# Patient Record
Sex: Female | Born: 1975 | Race: White | Hispanic: Yes | Marital: Married | Smoking: Never smoker
Health system: Southern US, Community
[De-identification: ages and names within clinical notes are randomized; demographics above are authoritative.]

## PROBLEM LIST (undated history)

## (undated) DIAGNOSIS — I10 Essential (primary) hypertension: Secondary | ICD-10-CM

## (undated) DIAGNOSIS — E119 Type 2 diabetes mellitus without complications: Secondary | ICD-10-CM

---

## 2005-02-23 ENCOUNTER — Inpatient Hospital Stay: Payer: Self-pay | Admitting: Unknown Physician Specialty

## 2006-04-21 ENCOUNTER — Ambulatory Visit: Payer: Self-pay | Admitting: Obstetrics and Gynecology

## 2007-07-04 ENCOUNTER — Encounter: Payer: Self-pay | Admitting: Maternal & Fetal Medicine

## 2007-10-06 ENCOUNTER — Encounter: Payer: Self-pay | Admitting: Maternal & Fetal Medicine

## 2007-11-29 ENCOUNTER — Inpatient Hospital Stay: Payer: Self-pay

## 2009-07-14 ENCOUNTER — Emergency Department: Payer: Self-pay | Admitting: Emergency Medicine

## 2012-08-24 ENCOUNTER — Emergency Department: Payer: Self-pay | Admitting: Emergency Medicine

## 2013-02-01 ENCOUNTER — Encounter: Payer: Self-pay | Admitting: *Deleted

## 2013-03-23 ENCOUNTER — Encounter: Payer: Self-pay | Admitting: Obstetrics & Gynecology

## 2013-06-28 ENCOUNTER — Emergency Department: Payer: Self-pay | Admitting: Emergency Medicine

## 2013-06-28 LAB — CBC WITH DIFFERENTIAL/PLATELET
Basophil #: 0 10*3/uL (ref 0.0–0.1)
Basophil %: 0.5 %
Eosinophil #: 0.2 10*3/uL (ref 0.0–0.7)
Eosinophil %: 2.3 %
HCT: 41.2 % (ref 35.0–47.0)
HGB: 14 g/dL (ref 12.0–16.0)
LYMPHS ABS: 2 10*3/uL (ref 1.0–3.6)
LYMPHS PCT: 26 %
MCH: 31.5 pg (ref 26.0–34.0)
MCHC: 33.9 g/dL (ref 32.0–36.0)
MCV: 93 fL (ref 80–100)
MONOS PCT: 7.3 %
Monocyte #: 0.6 x10 3/mm (ref 0.2–0.9)
Neutrophil #: 5 10*3/uL (ref 1.4–6.5)
Neutrophil %: 63.9 %
Platelet: 252 10*3/uL (ref 150–440)
RBC: 4.43 10*6/uL (ref 3.80–5.20)
RDW: 13.7 % (ref 11.5–14.5)
WBC: 7.8 10*3/uL (ref 3.6–11.0)

## 2013-06-28 LAB — URINALYSIS, COMPLETE
Bacteria: NONE SEEN
Bilirubin,UR: NEGATIVE
Blood: NEGATIVE
Ketone: NEGATIVE
LEUKOCYTE ESTERASE: NEGATIVE
NITRITE: NEGATIVE
PH: 7 (ref 4.5–8.0)
Protein: NEGATIVE
RBC, UR: NONE SEEN /HPF (ref 0–5)
Specific Gravity: 1.022 (ref 1.003–1.030)
Squamous Epithelial: 2

## 2013-06-28 LAB — COMPREHENSIVE METABOLIC PANEL
Albumin: 3.6 g/dL (ref 3.4–5.0)
Alkaline Phosphatase: 168 U/L — ABNORMAL HIGH
Anion Gap: 6 — ABNORMAL LOW (ref 7–16)
BILIRUBIN TOTAL: 0.4 mg/dL (ref 0.2–1.0)
BUN: 6 mg/dL — ABNORMAL LOW (ref 7–18)
CALCIUM: 8.5 mg/dL (ref 8.5–10.1)
Chloride: 109 mmol/L — ABNORMAL HIGH (ref 98–107)
Co2: 27 mmol/L (ref 21–32)
Creatinine: 0.42 mg/dL — ABNORMAL LOW (ref 0.60–1.30)
EGFR (African American): >60
Glucose: 231 mg/dL — ABNORMAL HIGH (ref 65–99)
OSMOLALITY: 288 (ref 275–301)
Potassium: 3.7 mmol/L (ref 3.5–5.1)
SGOT(AST): 86 U/L — ABNORMAL HIGH (ref 15–37)
SGPT (ALT): 91 U/L — ABNORMAL HIGH (ref 12–78)
Sodium: 142 mmol/L (ref 136–145)
Total Protein: 7.6 g/dL (ref 6.4–8.2)

## 2013-06-28 LAB — TROPONIN I: Troponin-I: <0.02 ng/mL

## 2013-06-28 LAB — LIPASE, BLOOD: Lipase: 194 U/L (ref 73–393)

## 2015-04-18 ENCOUNTER — Ambulatory Visit (INDEPENDENT_AMBULATORY_CARE_PROVIDER_SITE_OTHER)
Admission: RE | Admit: 2015-04-18 | Discharge: 2015-04-18 | Disposition: A | Discharge status: Home / Self Care | Payer: BLUE CROSS/BLUE SHIELD | Source: Ambulatory Visit | Attending: Internal Medicine | Admitting: Internal Medicine

## 2015-04-18 ENCOUNTER — Ambulatory Visit (INDEPENDENT_AMBULATORY_CARE_PROVIDER_SITE_OTHER): Payer: BLUE CROSS/BLUE SHIELD | Admitting: Internal Medicine

## 2015-04-18 ENCOUNTER — Encounter: Payer: Self-pay | Admitting: Internal Medicine

## 2015-04-18 VITALS — BP 134/82 | HR 83 | Temp 98.2°F | Ht 64.33 in | Wt 244.0 lb

## 2015-04-18 DIAGNOSIS — N611 Abscess of the breast and nipple: Secondary | ICD-10-CM

## 2015-04-18 DIAGNOSIS — M25572 Pain in left ankle and joints of left foot: Secondary | ICD-10-CM

## 2015-04-18 NOTE — Progress Notes (Signed)
Pre visit review using our clinic review tool, if applicable. No additional management support is needed unless otherwise documented below in the visit note. 

## 2015-04-18 NOTE — Patient Instructions (Signed)
Absceso (Abscess)  Un absceso es una zona infectada que contiene pus y desechos.Puede aparecer en cualquier parte del cuerpo. Tambin se lo conoce como fornculo o divieso. CAUSAS  Ocurre cuando los tejidos se infectan. Tambin puede formarse por obstruccin de las glndulas sebceas o las glndulas sudorparas, infeccin de los folculos pilosos o por una lesin pequea en la piel. A medida que el organismo lucha contra la infeccin, se acumula pus en la zona y hace presin debajo de la piel. Esta presin causa dolor. Las personas con un sistema inmunolgico debilitado tienen dificultad para Pension scheme manager las infecciones y pueden formar abscesos con ms frecuencia.  SNTOMAS  Generalmente un absceso se forma sobre la piel y se vuelve una masa dolorosa, roja, caliente y sensible. Si se forma debajo de la piel, podr sentir como una zona blanda, que se [place], debajo de la piel. Algunos abscesos se abren (ruptura) por s mismos, pero la mayora seguir empeorando si no se lo trata. La infeccin puede diseminarse hacia otros sitios del cuerpo y finalmente al torrente sanguneo y hace que el enfermo se sienta mal.  DIAGNSTICO  El mdico le har una historia clnica y un examen fsico. Podrn tomarle Truddie Coco de lquido del absceso y Web designer para Pension scheme manager la causa de la infeccin. .  TRATAMIENTO  El mdico le indicar antibiticos para combatir la infeccin. Sin embargo, el uso de antibiticos solamente no curar el absceso. El mdico tendr que hacer un pequeo corte (incisin) en el absceso para drenar el pus. En algunos casos se introduce una gasa en el absceso para reducir Conservation officer, historic buildings y que siga drenando la zona.  INSTRUCCIONES PARA EL CUIDADO EN EL HOGAR   Solo tome medicamentos de venta libre o recetados para Conservation officer, historic buildings, Tree surgeon o fiebre, segn las indicaciones del mdico.  Si le han recetado antibiticos, tmelos segn las indicaciones. Tmelos todos, aunque se sienta mejor.  Si le aplicaron  una gasa, siga las indicaciones del mdico para [place].  Para evitar la propagacin de la infeccin:  Mantenga el absceso cubierto con el vendaje.  Lvese bien las manos.  No comparta artculos de cuidado personal, toallas o jacuzzis con los dems.  Evite el contacto con la piel de Producer, television/film/video.  Mantenga la piel y la ropa limpia alrededor del absceso.  Cumpla con todas las visitas de control, segn le indique su mdico. SOLICITE ATENCIN MDICA SI:   Aumenta el dolor, la hinchazn, el enrojecimiento, drena lquido o sangra.  Siente dolores musculares, escalofros, o una sensacin general de Tree surgeon.  Tiene fiebre. ASEGRESE DE QUE:   Comprende estas instrucciones.  Controlar su enfermedad.  Solicitar ayuda de inmediato si no mejora o si empeora.   Esta informacin no tiene Marine scientist el consejo del mdico. Asegrese de hacerle al mdico cualquier pregunta que tenga.   Document Released: 02/23/2005 Document Revised: 08/25/2011 Elsevier Interactive Patient Education [employer].

## 2015-04-18 NOTE — Progress Notes (Signed)
HPI  Pt presents to the clinic today to establish care. She is spanish speaking only and did not notify us before hand that she needed an interpreter, so the interview and exam were very difficult.  She has a rash to her left breast. This started 3 weeks ago. At one point, the rash busted, leaked pus and blood. It has healed over but now the area is very itchy. She has not tried anything OTC.  She also reports left ankle pain. This started 3 years ago. The pain is worse with walking or standing for long periods of time. She is not sure if there has been any swelling. She can not tell me if there has been an injury. She takes Ibuprofen but is not sure if it helps.  Flu: 01/2015 Tetanus: unsure Pap Smear: 2016 Dentist: as needed  History reviewed. No pertinent past medical history.  No current outpatient prescriptions on file.   No current facility-administered medications for this visit.    No Known Allergies  Family History  Problem Relation Age of Onset  . Diabetes Mother     Social History   Social History  . Marital Status: Married    Spouse Name: N/A  . Number of Children: N/A  . Years of Education: N/A   Occupational History  . Not on file.   Social History Main Topics  . Smoking status: Never Smoker   . Smokeless tobacco: Never Used  . Alcohol Use: 0.0 oz/week    0 Standard drinks or equivalent per week     Comment: occasional  . Drug Use: No  . Sexual Activity: Not on file   Other Topics Concern  . Not on file   Social History Narrative  . No narrative on file    ROS:  Constitutional: Denies fever, malaise, fatigue, headache or abrupt weight changes.  Respiratory: Denies difficulty breathing, shortness of breath, cough or sputum production.   Cardiovascular: Denies chest pain, chest tightness, palpitations or swelling in the hands or feet.  Musculoskeletal: Pt reports left ankle pain. Denies decrease in range of motion, difficulty with gait, muscle  pain or joint swelling.  Skin: Pt reports rash to left breast. Denies redness, lesions or ulcercations.  Neurological: Denies dizziness, difficulty with memory, difficulty with speech or problems with balance and coordination.  Psych: Denies anxiety, depression, SI/HI.  No other specific complaints in a complete review of systems (except as listed in HPI above).  PE:  BP 134/82 mmHg  Pulse 83  Temp(Src) 98.2 F (36.8 C) (Oral)  Ht 5' 4.33" (1.634 m)  Wt 244 lb (110.678 kg)  BMI 41.45 kg/m2  SpO2 98%  LMP 04/18/2015 Wt Readings from Last 3 Encounters:  04/18/15 244 lb (110.678 kg)    General: Appears her stated age, obese in NAD.  Skin: Small, round, < 1 cm healed abscess noted just adjacent to the nipple, at 2 oclock. No drainage, redness or tenderness noted. Cardiovascular: Normal rate and rhythm. S1,S2 noted.  No murmur, rubs or gallops noted.  Pulmonary/Chest: Normal effort and positive vesicular breath sounds. No respiratory distress. No wheezes, rales or ronchi noted.  Musculoskeletal: Normal flexion, extension and rotation of the left ankle. Generally tender with palpation. Strength 5/5 BLE. No signs of joint swelling. No difficulty with gait.  Neurological: Alert and oriented.   Psychiatric: Mood and affect normal. Behavior is normal. Judgment and thought content normal.     BMET    Component Value Date/Time   NA 142 06/28/2013  2107   K 3.7 06/28/2013 2107   CL 109* 06/28/2013 2107   CO2 27 06/28/2013 2107   GLUCOSE 231* 06/28/2013 2107   BUN 6* 06/28/2013 2107   CREATININE 0.42* 06/28/2013 2107   CALCIUM 8.5 06/28/2013 2107   GFRNONAA >60 06/28/2013 2107   GFRAA >60 06/28/2013 2107    Lipid Panel  No results found for: CHOL, TRIG, HDL, CHOLHDL, VLDL, LDLCALC  CBC    Component Value Date/Time   WBC 7.8 06/28/2013 2107   RBC 4.43 06/28/2013 2107   HGB 14.0 06/28/2013 2107   HCT 41.2 06/28/2013 2107   PLT 252 06/28/2013 2107   MCV 93 06/28/2013 2107    MCH 31.5 06/28/2013 2107   MCHC 33.9 06/28/2013 2107   RDW 13.7 06/28/2013 2107   LYMPHSABS 2.0 06/28/2013 2107   MONOABS 0.6 06/28/2013 2107   EOSABS 0.2 06/28/2013 2107   BASOSABS 0.0 06/28/2013 2107    Hgb A1C No results found for: HGBA1C   Assessment and Plan:  Abscess of left breast:  Seems to be resolving Hydrocortisone cream 1% BID prn for itching Handout given in spanish with return precautions  Left ankle pain:  Exam benign Will check xray of left ankle today  Make an appt for your annual exam, please make sure to let the front desk know you need an interpreter.

## 2015-08-02 ENCOUNTER — Other Ambulatory Visit: Payer: Self-pay | Admitting: Podiatry

## 2015-08-02 DIAGNOSIS — T148XXA Other injury of unspecified body region, initial encounter: Secondary | ICD-10-CM

## 2015-08-22 ENCOUNTER — Ambulatory Visit
Admission: RE | Admit: 2015-08-22 | Discharge: 2015-08-22 | Disposition: A | Discharge status: Home / Self Care | Payer: BLUE CROSS/BLUE SHIELD | Source: Ambulatory Visit | Attending: Podiatry | Admitting: Podiatry

## 2015-08-22 DIAGNOSIS — X58XXXA Exposure to other specified factors, initial encounter: Secondary | ICD-10-CM | POA: Insufficient documentation

## 2015-08-22 DIAGNOSIS — S96812A Strain of other specified muscles and tendons at ankle and foot level, left foot, initial encounter: Secondary | ICD-10-CM | POA: Insufficient documentation

## 2015-08-22 DIAGNOSIS — M659 Synovitis and tenosynovitis, unspecified: Secondary | ICD-10-CM | POA: Insufficient documentation

## 2015-08-22 DIAGNOSIS — T148XXA Other injury of unspecified body region, initial encounter: Secondary | ICD-10-CM

## 2015-08-22 DIAGNOSIS — T148 Other injury of unspecified body region: Secondary | ICD-10-CM | POA: Diagnosis present

## 2015-11-18 ENCOUNTER — Other Ambulatory Visit (HOSPITAL_COMMUNITY): Payer: Self-pay | Admitting: *Deleted

## 2015-11-18 DIAGNOSIS — N6452 Nipple discharge: Secondary | ICD-10-CM

## 2015-11-18 DIAGNOSIS — N644 Mastodynia: Secondary | ICD-10-CM

## 2015-11-22 ENCOUNTER — Ambulatory Visit
Admission: RE | Admit: 2015-11-22 | Discharge: 2015-11-22 | Disposition: A | Discharge status: Home / Self Care | Payer: No Typology Code available for payment source | Source: Ambulatory Visit | Attending: Obstetrics and Gynecology | Admitting: Obstetrics and Gynecology

## 2015-11-22 ENCOUNTER — Other Ambulatory Visit (HOSPITAL_COMMUNITY): Payer: Self-pay | Admitting: Obstetrics and Gynecology

## 2015-11-22 ENCOUNTER — Ambulatory Visit (HOSPITAL_COMMUNITY)
Admission: RE | Admit: 2015-11-22 | Discharge: 2015-11-22 | Disposition: A | Discharge status: Home / Self Care | Payer: Self-pay | Source: Ambulatory Visit | Attending: Obstetrics and Gynecology | Admitting: Obstetrics and Gynecology

## 2015-11-22 ENCOUNTER — Encounter (HOSPITAL_COMMUNITY): Payer: Self-pay | Admitting: *Deleted

## 2015-11-22 VITALS — BP 118/80 | Temp 98.9°F | Ht 63.0 in | Wt 255.4 lb

## 2015-11-22 DIAGNOSIS — N6452 Nipple discharge: Secondary | ICD-10-CM

## 2015-11-22 DIAGNOSIS — Z1239 Encounter for other screening for malignant neoplasm of breast: Secondary | ICD-10-CM

## 2015-11-22 DIAGNOSIS — N644 Mastodynia: Secondary | ICD-10-CM

## 2015-11-22 NOTE — Patient Instructions (Signed)
Explained breast self awareness to [employer]. Patient did not need a Pap smear today due to last Pap smear was 07/06/2014. Let her know BCCCP will cover Pap smears every 3 years unless has a history of abnormal Pap smears. Referred patient to the [facility] for diagnostic mammogram and possible left breast ultrasound. Appointment scheduled for Friday, November 22, 2015 at 1530. Breckyn Wenstrom verbalized understanding.  [place], Arvil Chaco, RN 3:45 PM

## 2015-11-22 NOTE — Progress Notes (Signed)
Complaints of left breast tenderness that comes and goes. Patient rates pain at a 4 out of 10. Patient states there is an area on the left outer breast next to the nipple that starts to itch then hurts and she squeezes a bloody/whitish colored discharge from area.   Pap Smear:  Pap smear not completed today. Last Pap smear was 07/06/2014 at the [facility] and normal. Per patient has no history of an abnormal Pap smear. Last Pap smear result is in EPIC.  Physical exam: Breasts Breasts symmetrical. No skin abnormalities right breast. On left breast on areola at 2 o'clock there is a patch of thickened skin with a scab. Patient stated she squeezed the discharge out a couple of days ago. Unable to express any discharge from area on left breast. No nipple retraction bilateral breasts. No nipple discharge bilateral breasts. No lymphadenopathy. No lumps palpated bilateral breasts.Complaints of tenderness around 2 o'clock left breast where the area of concern is located. Referred patient to the [facility] for diagnostic mammogram and possible left breast ultrasound. Appointment scheduled for Friday, November 22, 2015 at 1530.        Pelvic/Bimanual No Pap smear completed today since last Pap smear was 07/06/2014. Pap smear not indicated per BCCCP guidelines.   Smoking History: Patient has never smoked.  Patient Navigation: Patient education provided. Access to services provided for patient through [facility] program.Spanish interpreter provided.   Used Spanish interpreter [employer] from [place].

## 2015-11-26 ENCOUNTER — Encounter (HOSPITAL_COMMUNITY): Payer: Self-pay | Admitting: *Deleted

## 2015-12-06 ENCOUNTER — Other Ambulatory Visit: Payer: No Typology Code available for payment source

## 2016-10-28 ENCOUNTER — Encounter (HOSPITAL_COMMUNITY): Payer: Self-pay

## 2016-10-28 ENCOUNTER — Emergency Department (HOSPITAL_COMMUNITY): Payer: Self-pay

## 2016-10-28 ENCOUNTER — Emergency Department (HOSPITAL_COMMUNITY)
Admission: EM | Admit: 2016-10-28 | Discharge: 2016-10-29 | Disposition: A | Discharge status: Home / Self Care | Payer: Self-pay | Attending: Emergency Medicine | Admitting: Emergency Medicine

## 2016-10-28 DIAGNOSIS — K8050 Calculus of bile duct without cholangitis or cholecystitis without obstruction: Secondary | ICD-10-CM | POA: Insufficient documentation

## 2016-10-28 DIAGNOSIS — Z791 Long term (current) use of non-steroidal anti-inflammatories (NSAID): Secondary | ICD-10-CM | POA: Insufficient documentation

## 2016-10-28 DIAGNOSIS — R109 Unspecified abdominal pain: Secondary | ICD-10-CM

## 2016-10-28 DIAGNOSIS — R079 Chest pain, unspecified: Secondary | ICD-10-CM | POA: Insufficient documentation

## 2016-10-28 LAB — URINALYSIS, ROUTINE W REFLEX MICROSCOPIC
BILIRUBIN URINE: NEGATIVE
GLUCOSE, UA: NEGATIVE mg/dL
Ketones, ur: 5 mg/dL — AB
Leukocytes, UA: NEGATIVE
NITRITE: NEGATIVE
PROTEIN: 30 mg/dL — AB
Specific Gravity, Urine: >1.046 — ABNORMAL HIGH (ref 1.005–1.030)
pH: 5 (ref 5.0–8.0)

## 2016-10-28 LAB — HEPATIC FUNCTION PANEL
ALT: 19 U/L (ref 14–54)
AST: 17 U/L (ref 15–41)
Albumin: 3.7 g/dL (ref 3.5–5.0)
Alkaline Phosphatase: 91 U/L (ref 38–126)
BILIRUBIN DIRECT: 0.2 mg/dL (ref 0.1–0.5)
BILIRUBIN INDIRECT: 0.6 mg/dL (ref 0.3–0.9)
TOTAL PROTEIN: 7.5 g/dL (ref 6.5–8.1)
Total Bilirubin: 0.8 mg/dL (ref 0.3–1.2)

## 2016-10-28 LAB — CBC
HEMATOCRIT: 43.2 % (ref 36.0–46.0)
Hemoglobin: 14.6 g/dL (ref 12.0–15.0)
MCH: 30.7 pg (ref 26.0–34.0)
MCHC: 33.8 g/dL (ref 30.0–36.0)
MCV: 90.9 fL (ref 78.0–100.0)
PLATELETS: 274 10*3/uL (ref 150–400)
RBC: 4.75 MIL/uL (ref 3.87–5.11)
RDW: 13.4 % (ref 11.5–15.5)
WBC: 9.9 10*3/uL (ref 4.0–10.5)

## 2016-10-28 LAB — BASIC METABOLIC PANEL
Anion gap: 7 (ref 5–15)
BUN: 6 mg/dL (ref 6–20)
CO2: 24 mmol/L (ref 22–32)
CREATININE: 0.46 mg/dL (ref 0.44–1.00)
Calcium: 8.9 mg/dL (ref 8.9–10.3)
Chloride: 105 mmol/L (ref 101–111)
GFR calc Af Amer: >60 mL/min (ref >60)
Glucose, Bld: 114 mg/dL — ABNORMAL HIGH (ref 65–99)
POTASSIUM: 3.8 mmol/L (ref 3.5–5.1)
SODIUM: 136 mmol/L (ref 135–145)

## 2016-10-28 LAB — I-STAT TROPONIN, ED: Troponin i, poc: 0 ng/mL (ref 0.00–0.08)

## 2016-10-28 LAB — D-DIMER, QUANTITATIVE (NOT AT ARMC): D DIMER QUANT: 0.53 ug{FEU}/mL — AB (ref 0.00–0.50)

## 2016-10-28 MED ORDER — MORPHINE SULFATE (PF) 4 MG/ML IV SOLN
4.0000 mg | Freq: Once | INTRAVENOUS | Status: AC
  Administered 2016-10-28: 4 mg via INTRAVENOUS
  Filled 2016-10-28: qty 1

## 2016-10-28 MED ORDER — KETOROLAC TROMETHAMINE 60 MG/2ML IM SOLN
60.0000 mg | Freq: Once | INTRAMUSCULAR | Status: AC
  Administered 2016-10-28: 60 mg via INTRAMUSCULAR
  Filled 2016-10-28: qty 2

## 2016-10-28 MED ORDER — OXYCODONE-ACETAMINOPHEN 5-325 MG PO TABS
2.0000 | ORAL_TABLET | Freq: Once | ORAL | Status: AC
  Administered 2016-10-28: 2 via ORAL
  Filled 2016-10-28: qty 2

## 2016-10-28 MED ORDER — IOPAMIDOL (ISOVUE-370) INJECTION 76%
INTRAVENOUS | Status: AC
  Administered 2016-10-28: 100 mL
  Filled 2016-10-28: qty 100

## 2016-10-28 MED ORDER — OXYCODONE-ACETAMINOPHEN 5-325 MG PO TABS: 2.0000 | ORAL_TABLET | ORAL | 0 refills | Status: DC | PRN

## 2016-10-28 MED ORDER — ORPHENADRINE CITRATE 30 MG/ML IJ SOLN
60.0000 mg | Freq: Two times a day (BID) | INTRAMUSCULAR | Status: DC
  Filled 2016-10-28: qty 2

## 2016-10-28 MED ORDER — CYCLOBENZAPRINE HCL 10 MG PO TABS
10.0000 mg | ORAL_TABLET | Freq: Two times a day (BID) | ORAL | Status: DC
  Administered 2016-10-28: 10 mg via ORAL
  Filled 2016-10-28: qty 1

## 2016-10-28 NOTE — ED Provider Notes (Signed)
[facility] DEPT Provider Note   CSN: [id number] Arrival date & time: 10/28/16  1308     History   Chief Complaint Chief Complaint  Patient presents with  . Chest Pain  . Flank Pain    HPI Janet Franco is a 41 y.o. female.  HPI Patient reports she developed a very severe pain in her right flank day before yesterday. Any kind of movement makes it much worse. Coughing makes it worse, deep breath makes it worse. Patient reports she also feels a feeling in her right arm like it is "going to sleep". Patient denies she feels short of breath. She denies she's had any cough or fever. She does not have abdominal pain but she does report when I push in her abdomen that makes the pain in her back worse. Patient denies any pain or burning with urination. She denies any blood in the urine. He denies history of kidney stone. Patient denies pain or swelling in her lower legs. She denies any history of blood clot. She denies family history of blood clot. Patient did have recent long car trip driving from [place]. Patient did try applying a type of salve or balm which gave some relief. Patient reports she has her tubes tied. She reports she is not menstruating. History reviewed. No pertinent past medical history.  There are no active problems to display for this patient.   History reviewed. No pertinent surgical history.  OB History    Gravida Para Term Preterm AB Living   0 0 0 0 0 0   SAB TAB Ectopic Multiple Live Births   0 0 0 0 0       Home Medications    Prior to Admission medications   Medication Sig Start Date End Date Taking? Authorizing Provider  ibuprofen (ADVIL,MOTRIN) 200 MG tablet Take 200-400 mg by mouth every 6 (six) hours as needed (for pain).   Yes [provider]    Family History Family History  Problem Relation Age of Onset  . Diabetes Mother     Social History Social History  Substance Use Topics  . Smoking status: Never Smoker  . Smokeless  tobacco: Never Used  . Alcohol use 0.0 oz/week     Comment: occasional     Allergies   Patient has no known allergies.   Review of Systems Review of Systems 10 Systems reviewed and are negative for acute change except as noted in the HPI.   Physical Exam Updated Vital Signs BP 110/68   Pulse 78   Temp 98.7 F (37.1 C) (Oral)   Resp 18   Ht 5\' 5"  (1.651 m)   SpO2 96%   Physical Exam  Constitutional:  Patient is alert and nontoxic. No respiratory distress. She does appear to be in significant pain holding her right side of the back. Small movements cause her to wince.  HENT:  Head: Normocephalic and atraumatic.  Eyes: EOM are normal.  Neck: Neck supple.  Cardiovascular: Normal rate, regular rhythm, normal heart sounds and intact distal pulses.   Pulmonary/Chest: Effort normal and breath sounds normal.  Patient does not have a significantly reproducible chest wall pain. She does report however any small movements that she make such as twisting or bending forward reproduce the pain. No Rashes of the chest wall. No soft tissue anomaly.  Abdominal: Soft. She exhibits no distension. There is no tenderness. There is no guarding.  Patient denies palpation tenderness to the abdomen but she does report that pressure  in the right upper quadrant makes the pain in her back worse.  Musculoskeletal: Normal range of motion. She exhibits no edema or tenderness.  Patient has no peripheral edema of the lower extremity. She has no tenderness to palpation in the popliteal fossa or the calves.  Skin: Skin is warm and dry.  Psychiatric: She has a normal mood and affect.     ED Treatments / Results  Labs (all labs ordered are listed, but only abnormal results are displayed) Labs Reviewed  BASIC METABOLIC PANEL - Abnormal; Notable for the following:       Result Value   Glucose, Bld 114 (*)    All other components within normal limits  URINALYSIS, ROUTINE W REFLEX MICROSCOPIC - Abnormal;  Notable for the following:    Specific Gravity, Urine >1.046 (*)    Hgb urine dipstick SMALL (*)    Ketones, ur 5 (*)    Protein, ur 30 (*)    Bacteria, UA RARE (*)    Squamous Epithelial / LPF 0-5 (*)    All other components within normal limits  D-DIMER, QUANTITATIVE (NOT AT [facility]) - Abnormal; Notable for the following:    D-Dimer, Quant 0.53 (*)    All other components within normal limits  CBC  HEPATIC FUNCTION PANEL  I-STAT TROPONIN, ED    EKG  EKG Interpretation  Date/Time:  Wednesday October 28 2016 13:59:01 EDT Ventricular Rate:  91 PR Interval:  152 QRS Duration: 92 QT Interval:  370 QTC Calculation: 455 R Axis:   6 Text Interpretation:  Normal sinus rhythm Cannot rule out Anterior infarct , age undetermined Abnormal ECG no sig change from previous. Confirmed by Charlesetta Shanks [id number]) on 10/28/2016 3:56:03 PM       Radiology Dg Chest 2 View  Result Date: 10/28/2016 CLINICAL DATA:  Posterior chest pain. EXAM: CHEST  2 VIEW COMPARISON:  None. FINDINGS: The cardiomediastinal silhouette is at the upper limits of normal in size. Normal pulmonary vascularity. Low lung volumes are present, causing crowding of the pulmonary vasculature. No focal consolidation, pleural effusion, or pneumothorax. No acute osseous abnormality. IMPRESSION: No active cardiopulmonary disease. Electronically Signed   By: Titus Dubin M.D.   On: 10/28/2016 14:14   Ct Angio Chest Pe W/cm &/or Wo Cm  Result Date: 10/28/2016 CLINICAL DATA:  Chest pain and dyspnea.  Right flank pain. EXAM: CT ANGIOGRAPHY CHEST CT ABDOMEN AND PELVIS WITH CONTRAST TECHNIQUE: Multidetector CT imaging of the chest was performed using the standard protocol during bolus administration of intravenous contrast. Multiplanar CT image reconstructions and MIPs were obtained to evaluate the vascular anatomy. Multidetector CT imaging of the abdomen and pelvis was performed using the standard protocol during bolus administration of  intravenous contrast. CONTRAST:  100 cc Isovue 370 COMPARISON:  None. FINDINGS: CTA CHEST FINDINGS Cardiovascular: The heart is normal in size. No pericardial effusion. The aorta is normal in caliber. No dissection. No atherosclerotic calcifications. The branch vessels are patent. No coronary artery calcifications are identified. The pulmonary arterial tree is fairly well opacified. No filling defects to suggest pulmonary embolism. Mediastinum/Nodes: No mediastinal or hilar mass or adenopathy. The esophagus is grossly normal. Lungs/Pleura: Exam is somewhat limited by body habitus and respiratory motion. There is a small right pleural effusion with overlying atelectasis. Minimal left basilar atelectasis also noted. No definite infiltrates or pulmonary lesions. Chest wall/ Musculoskeletal: No breast masses, supraclavicular or axillary lymphadenopathy. Thyroid gland appears normal. No significant bony findings. Review of the MIP images confirms the above  findings. CT ABDOMEN and PELVIS FINDINGS Hepatobiliary: No focal hepatic lesions or intrahepatic biliary dilatation. Suspect gallstones. No definite gallbladder wall thickening or pericholecystic fluid. There is common bile duct dilatation with maximum measurement of 10 mm. It does taper of to the ampulla in the head of the pancreas. Recommend correlation with liver function studies. If these are abnormal I would started with a right upper quadrant ultrasound examination. Pancreas: No mass, inflammation or ductal dilatation. Spleen: Normal size.  No focal lesions. Adrenals/Urinary Tract: The adrenal glands and kidneys are unremarkable. No renal, ureteral or bladder calculi or mass. Stomach/Bowel: The stomach, duodenum, small bowel and colon are unremarkable. No acute inflammatory process, mass lesions or obstructive findings. The terminal ileum is normal. The appendix is normal. Vascular/Lymphatic: The aorta is normal in caliber. No dissection. The branch vessels are  patent. The major venous structures are patent. No mesenteric or retroperitoneal mass or adenopathy. Small scattered lymph nodes are noted. Reproductive: An IUD is noted in the endometrial canal. No complicating features. Both ovaries are normal. Other: No pelvic mass or adenopathy. No free pelvic fluid collections. No inguinal mass or adenopathy. No abdominal wall hernia or subcutaneous lesions. Musculoskeletal: No significant bony findings. Review of the MIP images confirms the above findings. IMPRESSION: 1. No CT findings for pulmonary embolism. 2. Small right effusion and right lower lobe atelectasis. 3. Normal thoracic aorta. 4. Suspect gallstones. There is also common bile duct dilatation. Recommend correlation with liver function studies. Right upper quadrant ultrasound may be helpful for further evaluation. 5. No other significant abdominal/pelvic findings. Electronically Signed   By: Marijo Sanes M.D.   On: 10/28/2016 19:18   Ct Abdomen Pelvis W Contrast  Result Date: 10/28/2016 CLINICAL DATA:  Chest pain and dyspnea.  Right flank pain. EXAM: CT ANGIOGRAPHY CHEST CT ABDOMEN AND PELVIS WITH CONTRAST TECHNIQUE: Multidetector CT imaging of the chest was performed using the standard protocol during bolus administration of intravenous contrast. Multiplanar CT image reconstructions and MIPs were obtained to evaluate the vascular anatomy. Multidetector CT imaging of the abdomen and pelvis was performed using the standard protocol during bolus administration of intravenous contrast. CONTRAST:  100 cc Isovue 370 COMPARISON:  None. FINDINGS: CTA CHEST FINDINGS Cardiovascular: The heart is normal in size. No pericardial effusion. The aorta is normal in caliber. No dissection. No atherosclerotic calcifications. The branch vessels are patent. No coronary artery calcifications are identified. The pulmonary arterial tree is fairly well opacified. No filling defects to suggest pulmonary embolism. Mediastinum/Nodes: No  mediastinal or hilar mass or adenopathy. The esophagus is grossly normal. Lungs/Pleura: Exam is somewhat limited by body habitus and respiratory motion. There is a small right pleural effusion with overlying atelectasis. Minimal left basilar atelectasis also noted. No definite infiltrates or pulmonary lesions. Chest wall/ Musculoskeletal: No breast masses, supraclavicular or axillary lymphadenopathy. Thyroid gland appears normal. No significant bony findings. Review of the MIP images confirms the above findings. CT ABDOMEN and PELVIS FINDINGS Hepatobiliary: No focal hepatic lesions or intrahepatic biliary dilatation. Suspect gallstones. No definite gallbladder wall thickening or pericholecystic fluid. There is common bile duct dilatation with maximum measurement of 10 mm. It does taper of to the ampulla in the head of the pancreas. Recommend correlation with liver function studies. If these are abnormal I would started with a right upper quadrant ultrasound examination. Pancreas: No mass, inflammation or ductal dilatation. Spleen: Normal size.  No focal lesions. Adrenals/Urinary Tract: The adrenal glands and kidneys are unremarkable. No renal, ureteral or bladder calculi or  mass. Stomach/Bowel: The stomach, duodenum, small bowel and colon are unremarkable. No acute inflammatory process, mass lesions or obstructive findings. The terminal ileum is normal. The appendix is normal. Vascular/Lymphatic: The aorta is normal in caliber. No dissection. The branch vessels are patent. The major venous structures are patent. No mesenteric or retroperitoneal mass or adenopathy. Small scattered lymph nodes are noted. Reproductive: An IUD is noted in the endometrial canal. No complicating features. Both ovaries are normal. Other: No pelvic mass or adenopathy. No free pelvic fluid collections. No inguinal mass or adenopathy. No abdominal wall hernia or subcutaneous lesions. Musculoskeletal: No significant bony findings. Review of  the MIP images confirms the above findings. IMPRESSION: 1. No CT findings for pulmonary embolism. 2. Small right effusion and right lower lobe atelectasis. 3. Normal thoracic aorta. 4. Suspect gallstones. There is also common bile duct dilatation. Recommend correlation with liver function studies. Right upper quadrant ultrasound may be helpful for further evaluation. 5. No other significant abdominal/pelvic findings. Electronically Signed   By: Marijo Sanes M.D.   On: 10/28/2016 19:18   US Abdomen Limited Ruq  Result Date: 10/28/2016 CLINICAL DATA:  41 y/o  F; right flank pain. EXAM: ULTRASOUND ABDOMEN LIMITED RIGHT UPPER QUADRANT COMPARISON:  None. FINDINGS: Gallbladder: Multiple gallstones measuring up to 8 mm. Normal gallbladder wall thickness. No pericholecystic fluid. Negative sonographic Murphy's sign. Common bile duct: Diameter: 4.6 mm Liver: Mildly increased liver echogenicity compatible with steatosis. Portal vein is patent on color Doppler imaging with normal direction of blood flow towards the liver. IMPRESSION: Gallstones. No secondary signs of acute cholecystitis. Hepatic steatosis. Electronically Signed   By: Kristine Garbe M.D.   On: 10/28/2016 21:11    Procedures Procedures (including critical care time)  Medications Ordered in ED Medications  cyclobenzaprine (FLEXERIL) tablet 10 mg (10 mg Oral Given 10/28/16 1758)  ketorolac (TORADOL) injection 60 mg (60 mg Intramuscular Given 10/28/16 1758)  morphine 4 MG/ML injection 4 mg (4 mg Intravenous Given 10/28/16 1758)  iopamidol (ISOVUE-370) 76 % injection (100 mLs  Contrast Given 10/28/16 1829)  morphine 4 MG/ML injection 4 mg (4 mg Intravenous Given 10/28/16 2018)     Initial Impression / Assessment and Plan / ED Course  I have reviewed the triage vital signs and the nursing notes.  Pertinent labs & imaging results that were available during my care of the patient were reviewed by me and considered in my medical decision  making (see chart for details).    17:20 patient continues to report severe flank pain after administration of Toradol. D-dimer positive. We'll proceed with CT PE study and abdominal study for assessment as well of kidney and gallbladder.  Consult: Review Dr. Hulen Skains. Patient to follow-up for biliary colic as outpatient.  Final Clinical Impressions(s) / ED Diagnoses   Final diagnoses:  Biliary colic   Patient presented with severe right flank, lower thoracic pain. She had had recent long car ride. Concern was for possible PE. This is negative. Although patient did not have significant abdominal pain, CT and ultrasound show significant gallstones. She however does not have acute cholecystitis. Patient was ultimately pain control with morphine. At this time plan will be to follow-up with [facility] surgery patient is counseled on dietary measures for gallbladder and written instructions provided.  New Prescriptions New Prescriptions   No medications on file     Charlesetta Shanks, MD 10/28/16 2332

## 2016-10-28 NOTE — ED Notes (Signed)
Rep[facility] General Surg

## 2016-10-28 NOTE — ED Triage Notes (Signed)
Pt presents for evaluation of R flank pain with chest pain with deep breathing since yesterday. Pt recently returned from [place], pt drove. Pt reports pain to R arm last night.

## 2016-10-28 NOTE — Discharge Instructions (Signed)
1. Call [facility] [phone] to schedule an appointment as soon as possible. 2. You will need to have surgery to have her gallbladder removed. You will need to see a surgeon first and get evaluated and then a surgery scheduled.

## 2016-10-28 NOTE — ED Notes (Signed)
Pt aware we need urine.

## 2017-11-11 ENCOUNTER — Ambulatory Visit (INDEPENDENT_AMBULATORY_CARE_PROVIDER_SITE_OTHER)
Admission: RE | Admit: 2017-11-11 | Discharge: 2017-11-11 | Disposition: A | Discharge status: Home / Self Care | Payer: No Typology Code available for payment source | Source: Ambulatory Visit | Attending: Internal Medicine | Admitting: Internal Medicine

## 2017-11-11 ENCOUNTER — Ambulatory Visit (INDEPENDENT_AMBULATORY_CARE_PROVIDER_SITE_OTHER): Payer: Self-pay | Admitting: Internal Medicine

## 2017-11-11 ENCOUNTER — Encounter: Payer: Self-pay | Admitting: Internal Medicine

## 2017-11-11 VITALS — BP 122/84 | HR 78 | Temp 98.3°F | Wt 265.0 lb

## 2017-11-11 DIAGNOSIS — M79671 Pain in right foot: Secondary | ICD-10-CM

## 2017-11-11 DIAGNOSIS — R202 Paresthesia of skin: Secondary | ICD-10-CM

## 2017-11-11 DIAGNOSIS — M79672 Pain in left foot: Secondary | ICD-10-CM

## 2017-11-11 MED ORDER — DICLOFENAC SODIUM 75 MG PO TBEC: 75.0000 mg | DELAYED_RELEASE_TABLET | Freq: Two times a day (BID) | ORAL | 2 refills | Status: DC

## 2017-11-11 NOTE — Patient Instructions (Signed)
Foot Pain Many things can cause foot pain. Some common causes are:  An injury.  A sprain.  Arthritis.  Blisters.  Bunions.  Follow these instructions at home: Pay attention to any changes in your symptoms. Take these actions to help with your discomfort:  If directed, put ice on the affected area: ? Put ice in a plastic bag. ? Place a towel between your skin and the bag. ? Leave the ice on for 15-20 minutes, 3?4 times a day for 2 days.  Take over-the-counter and prescription medicines only as told by your health care provider.  Wear comfortable, supportive shoes that fit you well. Do not wear high heels.  Do not stand or walk for long periods of time.  Do not lift a lot of weight. This can put added pressure on your feet.  Do stretches to relieve foot pain and stiffness as told by your health care provider.  Rub your foot gently.  Keep your feet clean and dry.  Contact a health care provider if:  Your pain does not get better after a few days of self-care.  Your pain gets worse.  You cannot stand on your foot. Get help right away if:  Your foot is numb or tingling.  Your foot or toes are swollen.  Your foot or toes turn white or blue.  You have warmth and redness along your foot. This information is not intended to replace advice given to you by your health care provider. Make sure you discuss any questions you have with your health care provider. Document Released: 03/22/2015 Document Revised: 08/01/2015 Document Reviewed: 03/21/2014 Elsevier Interactive Patient Education  2018 [employer] Inc[employer]  

## 2017-11-11 NOTE — Progress Notes (Signed)
Subjective:    Patient ID: Janet Franco, female    DOB: [birth year], 42 y.o.   MRN: [id number]  HPI  Pt presents to the clinic today with c/o pain in her feet. She reports this started 1 year ago. The pain is located on the top and bottom of her feet and radiates up to her ankle. She reports associated cramping, numbnees and tingling. She has difficulty bearing weight. At work, she stands on her feet for extended periods of time which makes her pain worse. She did have a right ankle fracture 1 year ago which required surgery. She has had constant pain since that time. She has taken Ibuprofen with minimal relief.  Review of Systems  No past medical history on file.  Current Outpatient Medications  Medication Sig Dispense Refill  . ibuprofen (ADVIL,MOTRIN) 200 MG tablet Take 200-400 mg by mouth every 6 (six) hours as needed (for pain).    Marland Kitchen oxyCODONE-acetaminophen (PERCOCET) 5-325 MG tablet Take 2 tablets by mouth every 4 (four) hours as needed. 20 tablet 0   No current facility-administered medications for this visit.     No Known Allergies  Family History  Problem Relation Age of Onset  . Diabetes Mother     Social History   Socioeconomic History  . Marital status: Married    Spouse name: Not on file  . Number of children: Not on file  . Years of education: Not on file  . Highest education level: Not on file  Occupational History  . Not on file  Social Needs  . Financial resource strain: Not on file  . Food insecurity:    Worry: Not on file    Inability: Not on file  . Transportation needs:    Medical: Not on file    Non-medical: Not on file  Tobacco Use  . Smoking status: Never Smoker  . Smokeless tobacco: Never Used  Substance and Sexual Activity  . Alcohol use: Yes    Alcohol/week: 0.0 standard drinks    Comment: occasional  . Drug use: No  . Sexual activity: Not on file  Lifestyle  . Physical activity:    Days per week: Not on file    Minutes per session:  Not on file  . Stress: Not on file  Relationships  . Social connections:    Talks on phone: Not on file    Gets together: Not on file    Attends religious service: Not on file    Active member of club or organization: Not on file    Attends meetings of clubs or organizations: Not on file    Relationship status: Not on file  . Intimate partner violence:    Fear of current or ex partner: Not on file    Emotionally abused: Not on file    Physically abused: Not on file    Forced sexual activity: Not on file  Other Topics Concern  . Not on file  Social History Narrative  . Not on file     Constitutional: Denies fever, malaise, fatigue, headache or abrupt weight changes.  Musculoskeletal: Pt reports bilateral foot pain. Denies muscle pain or joint swelling.  Neurological: Pt reports numbness, tingling of BLE. Denies problems with balance and coordination.    No other specific complaints in a complete review of systems (except as listed in HPI above).     Objective:   Physical Exam  BP 122/84   Pulse 78   Temp 98.3 F (36.8 C) (Oral)  Wt 265 lb (120.2 kg)   LMP 10/11/2017   SpO2 98%   BMI 44.10 kg/m  Wt Readings from Last 3 Encounters:  11/11/17 265 lb (120.2 kg)  11/22/15 255 lb 6.4 oz (115.8 kg)  04/18/15 244 lb (110.7 kg)    General: Appears her stated age, obese, in NAD. Skin: Fungal infection noted of bilateral toenails. Musculoskeletal: Normal flexion, extension and rotation of the ankles. She has swelling inferior to the medial malleolus on the left. Pain with palpation of bilateral heels. Pain with palpation of the arch on the left. Unable to tandem walk, walk on toes and heels. Strength 5/5 BLE. Neurological: Alert and oriented. Sensation intact to BLE.   BMET    Component Value Date/Time   NA 136 10/28/2016 1356   NA 142 06/28/2013 2107   K 3.8 10/28/2016 1356   K 3.7 06/28/2013 2107   CL 105 10/28/2016 1356   CL 109 (H) 06/28/2013 2107   CO2 24  10/28/2016 1356   CO2 27 06/28/2013 2107   GLUCOSE 114 (H) 10/28/2016 1356   GLUCOSE 231 (H) 06/28/2013 2107   BUN 6 10/28/2016 1356   BUN 6 (L) 06/28/2013 2107   CREATININE 0.46 10/28/2016 1356   CREATININE 0.42 (L) 06/28/2013 2107   CALCIUM 8.9 10/28/2016 1356   CALCIUM 8.5 06/28/2013 2107   GFRNONAA >60 10/28/2016 1356   GFRNONAA >60 06/28/2013 2107   GFRAA >60 10/28/2016 1356   GFRAA >60 06/28/2013 2107    Lipid Panel  No results found for: CHOL, TRIG, HDL, CHOLHDL, VLDL, LDLCALC  CBC    Component Value Date/Time   WBC 9.9 10/28/2016 1356   RBC 4.75 10/28/2016 1356   HGB 14.6 10/28/2016 1356   HGB 14.0 06/28/2013 2107   HCT 43.2 10/28/2016 1356   HCT 41.2 06/28/2013 2107   PLT 274 10/28/2016 1356   PLT 252 06/28/2013 2107   MCV 90.9 10/28/2016 1356   MCV 93 06/28/2013 2107   MCH 30.7 10/28/2016 1356   MCHC 33.8 10/28/2016 1356   RDW 13.4 10/28/2016 1356   RDW 13.7 06/28/2013 2107   LYMPHSABS 2.0 06/28/2013 2107   MONOABS 0.6 06/28/2013 2107   EOSABS 0.2 06/28/2013 2107   BASOSABS 0.0 06/28/2013 2107    Hgb A1C No results found for: HGBA1C          Assessment & Plan:   Bilateral Foot Pain, Paresthesia, L>R:  Due to lack of insurance, will obtain xray of left foot today Needs xray of right foot as well, but will hold off for now Could benefit from TSH, B12, Vit D but will hold off for now Encouraged weight loss eRx for Diclofenac 75 mg BID  Will follow up after xray, return precautions discussed Webb Silversmith, NP

## 2018-01-08 ENCOUNTER — Other Ambulatory Visit: Payer: Self-pay | Admitting: Internal Medicine

## 2018-01-10 NOTE — Telephone Encounter (Signed)
Last filled 11/11/17... Please advise

## 2018-02-09 ENCOUNTER — Other Ambulatory Visit: Payer: Self-pay | Admitting: Internal Medicine

## 2018-02-09 NOTE — Telephone Encounter (Signed)
Last filled 01/10/2018... Please advise

## 2018-02-23 ENCOUNTER — Other Ambulatory Visit: Payer: Self-pay | Admitting: Internal Medicine

## 2018-02-25 ENCOUNTER — Other Ambulatory Visit: Payer: Self-pay | Admitting: Internal Medicine

## 2018-02-25 MED ORDER — DICLOFENAC SODIUM 75 MG PO TBEC: 75.0000 mg | DELAYED_RELEASE_TABLET | Freq: Two times a day (BID) | ORAL | 0 refills | Status: DC

## 2018-02-25 NOTE — Addendum Note (Signed)
Addended by: Lurlean Nanny on: 02/25/2018 03:33 PM   Modules accepted: Orders

## 2018-09-28 ENCOUNTER — Other Ambulatory Visit: Payer: Self-pay | Admitting: Internal Medicine

## 2020-06-18 ENCOUNTER — Other Ambulatory Visit: Payer: Self-pay | Admitting: Obstetrics & Gynecology

## 2020-06-18 DIAGNOSIS — Z1231 Encounter for screening mammogram for malignant neoplasm of breast: Secondary | ICD-10-CM

## 2020-07-18 ENCOUNTER — Other Ambulatory Visit: Payer: Self-pay

## 2020-07-18 ENCOUNTER — Ambulatory Visit
Admission: RE | Admit: 2020-07-18 | Discharge: 2020-07-18 | Disposition: A | Discharge status: Home / Self Care | Payer: No Typology Code available for payment source | Source: Ambulatory Visit | Attending: Obstetrics & Gynecology | Admitting: Obstetrics & Gynecology

## 2020-07-18 DIAGNOSIS — Z1231 Encounter for screening mammogram for malignant neoplasm of breast: Secondary | ICD-10-CM

## 2021-12-04 ENCOUNTER — Emergency Department: Payer: BLUE CROSS/BLUE SHIELD

## 2021-12-04 ENCOUNTER — Inpatient Hospital Stay
Admission: EM | Admit: 2021-12-04 | Discharge: 2021-12-06 | DRG: 419 | Disposition: A | Discharge status: Home / Self Care | Payer: BLUE CROSS/BLUE SHIELD | Attending: General Surgery | Admitting: General Surgery

## 2021-12-04 DIAGNOSIS — R1032 Left lower quadrant pain: Secondary | ICD-10-CM | POA: Diagnosis present

## 2021-12-04 DIAGNOSIS — E119 Type 2 diabetes mellitus without complications: Secondary | ICD-10-CM | POA: Diagnosis present

## 2021-12-04 DIAGNOSIS — R109 Unspecified abdominal pain: Secondary | ICD-10-CM | POA: Diagnosis present

## 2021-12-04 DIAGNOSIS — K660 Peritoneal adhesions (postprocedural) (postinfection): Secondary | ICD-10-CM | POA: Diagnosis present

## 2021-12-04 DIAGNOSIS — I10 Essential (primary) hypertension: Secondary | ICD-10-CM | POA: Diagnosis present

## 2021-12-04 DIAGNOSIS — K8000 Calculus of gallbladder with acute cholecystitis without obstruction: Secondary | ICD-10-CM | POA: Diagnosis present

## 2021-12-04 DIAGNOSIS — K429 Umbilical hernia without obstruction or gangrene: Secondary | ICD-10-CM | POA: Diagnosis present

## 2021-12-04 DIAGNOSIS — Z79899 Other long term (current) drug therapy: Secondary | ICD-10-CM | POA: Diagnosis not present

## 2021-12-04 DIAGNOSIS — E669 Obesity, unspecified: Secondary | ICD-10-CM | POA: Diagnosis present

## 2021-12-04 DIAGNOSIS — Z6836 Body mass index (BMI) 36.0-36.9, adult: Secondary | ICD-10-CM

## 2021-12-04 DIAGNOSIS — K432 Incisional hernia without obstruction or gangrene: Secondary | ICD-10-CM | POA: Diagnosis present

## 2021-12-04 DIAGNOSIS — Z833 Family history of diabetes mellitus: Secondary | ICD-10-CM

## 2021-12-04 DIAGNOSIS — D179 Benign lipomatous neoplasm, unspecified: Secondary | ICD-10-CM | POA: Diagnosis present

## 2021-12-04 LAB — COMPREHENSIVE METABOLIC PANEL
ALT: 55 U/L — ABNORMAL HIGH (ref 0–44)
AST: 46 U/L — ABNORMAL HIGH (ref 15–41)
Albumin: 4.1 g/dL (ref 3.5–5.0)
Alkaline Phosphatase: 89 U/L (ref 38–126)
Anion gap: 9 (ref 5–15)
BUN: 8 mg/dL (ref 6–20)
CO2: 28 mmol/L (ref 22–32)
Calcium: 9.5 mg/dL (ref 8.9–10.3)
Chloride: 101 mmol/L (ref 98–111)
Creatinine, Ser: 0.46 mg/dL (ref 0.44–1.00)
GFR, Estimated: >60 mL/min (ref >60)
Glucose, Bld: 94 mg/dL (ref 70–99)
Potassium: 4.5 mmol/L (ref 3.5–5.1)
Sodium: 138 mmol/L (ref 135–145)
Total Bilirubin: 0.5 mg/dL (ref 0.3–1.2)
Total Protein: 8.4 g/dL — ABNORMAL HIGH (ref 6.5–8.1)

## 2021-12-04 LAB — CBC WITH DIFFERENTIAL/PLATELET
Abs Immature Granulocytes: 0.01 10*3/uL (ref 0.00–0.07)
Basophils Absolute: 0 10*3/uL (ref 0.0–0.1)
Basophils Relative: 1 %
Eosinophils Absolute: 0.3 10*3/uL (ref 0.0–0.5)
Eosinophils Relative: 4 %
HCT: 42.8 % (ref 36.0–46.0)
Hemoglobin: 13.9 g/dL (ref 12.0–15.0)
Immature Granulocytes: 0 %
Lymphocytes Relative: 36 %
Lymphs Abs: 2.4 10*3/uL (ref 0.7–4.0)
MCH: 29.6 pg (ref 26.0–34.0)
MCHC: 32.5 g/dL (ref 30.0–36.0)
MCV: 91.1 fL (ref 80.0–100.0)
Monocytes Absolute: 0.7 10*3/uL (ref 0.1–1.0)
Monocytes Relative: 11 %
Neutro Abs: 3.2 10*3/uL (ref 1.7–7.7)
Neutrophils Relative %: 48 %
Platelets: 281 10*3/uL (ref 150–400)
RBC: 4.7 MIL/uL (ref 3.87–5.11)
RDW: 13.8 % (ref 11.5–15.5)
WBC: 6.7 10*3/uL (ref 4.0–10.5)
nRBC: 0 % (ref 0.0–0.2)

## 2021-12-04 LAB — URINALYSIS, ROUTINE W REFLEX MICROSCOPIC
Bacteria, UA: NONE SEEN
Bilirubin Urine: NEGATIVE
Glucose, UA: NEGATIVE mg/dL
Hgb urine dipstick: NEGATIVE
Ketones, ur: NEGATIVE mg/dL
Nitrite: NEGATIVE
Protein, ur: 30 mg/dL — AB
Specific Gravity, Urine: 1.02 (ref 1.005–1.030)
pH: 5 (ref 5.0–8.0)

## 2021-12-04 LAB — LIPASE, BLOOD: Lipase: 40 U/L (ref 11–51)

## 2021-12-04 LAB — CBG MONITORING, ED: Glucose-Capillary: 133 mg/dL — ABNORMAL HIGH (ref 70–99)

## 2021-12-04 LAB — POC URINE PREG, ED: Preg Test, Ur: NEGATIVE

## 2021-12-04 MED ORDER — MORPHINE SULFATE (PF) 2 MG/ML IV SOLN
2.0000 mg | INTRAVENOUS | Status: DC | PRN
  Administered 2021-12-05: 2 mg via INTRAVENOUS
  Filled 2021-12-04: qty 1

## 2021-12-04 MED ORDER — TRAMADOL HCL 50 MG PO TABS
50.0000 mg | ORAL_TABLET | Freq: Four times a day (QID) | ORAL | Status: DC | PRN
  Administered 2021-12-04 – 2021-12-06 (×2): 50 mg via ORAL
  Filled 2021-12-04 (×2): qty 1

## 2021-12-04 MED ORDER — HYDROCODONE-ACETAMINOPHEN 5-325 MG PO TABS
1.0000 | ORAL_TABLET | ORAL | Status: DC | PRN
  Administered 2021-12-05 – 2021-12-06 (×2): 2 via ORAL
  Filled 2021-12-04 (×2): qty 2

## 2021-12-04 MED ORDER — LISINOPRIL 20 MG PO TABS
20.0000 mg | ORAL_TABLET | Freq: Every day | ORAL | Status: DC
  Administered 2021-12-06: 20 mg via ORAL
  Filled 2021-12-04 (×2): qty 1

## 2021-12-04 MED ORDER — IOHEXOL 300 MG/ML  SOLN
100.0000 mL | Freq: Once | INTRAMUSCULAR | Status: AC | PRN
  Administered 2021-12-04: 100 mL via INTRAVENOUS

## 2021-12-04 MED ORDER — DOCUSATE SODIUM 100 MG PO CAPS
100.0000 mg | ORAL_CAPSULE | Freq: Two times a day (BID) | ORAL | Status: DC | PRN
  Administered 2021-12-06: 100 mg via ORAL
  Filled 2021-12-04: qty 1

## 2021-12-04 MED ORDER — SODIUM CHLORIDE 0.9 % IV SOLN: INTRAVENOUS | Status: DC

## 2021-12-04 MED ORDER — ONDANSETRON HCL 4 MG/2ML IJ SOLN
4.0000 mg | Freq: Four times a day (QID) | INTRAMUSCULAR | Status: DC | PRN
  Administered 2021-12-05: 4 mg via INTRAVENOUS
  Filled 2021-12-04: qty 2

## 2021-12-04 MED ORDER — ONDANSETRON 4 MG PO TBDP: 4.0000 mg | ORAL_TABLET | Freq: Four times a day (QID) | ORAL | Status: DC | PRN

## 2021-12-04 MED ORDER — INSULIN ASPART 100 UNIT/ML IJ SOLN
0.0000 [IU] | Freq: Three times a day (TID) | INTRAMUSCULAR | Status: DC
  Administered 2021-12-06: 3 [IU] via SUBCUTANEOUS
  Filled 2021-12-04: qty 1

## 2021-12-04 NOTE — ED Provider Notes (Signed)
[facility] Provider Note    Event Date/Time   First MD Initiated Contact with Patient 12/04/21 1628     (approximate)   History   Abdominal Pain (X 5 days, no nausea, no vomiting )   HPI  Antonique Langford is a 46 y.o. female  who presents to the emergency department today because of concern for abdominal pain.  Patient states that pain started 4 days ago.  She points to the periumbilical region.  She states the pain is sharp.  She states she has had decreased appetite over the past couple days.  Does have a history of gallstones however never followed up.  Feels this pain is not quite as intense as it was with previous gallstone pain.  She denies any fevers.    Hospital interpreter utilized for h and p  Physical Exam   Triage Vital Signs: ED Triage Vitals  Enc Vitals Group     BP 12/04/21 1502 132/83     Pulse Rate 12/04/21 1228 88     Resp 12/04/21 1228 17     Temp 12/04/21 1228 98.7 F (37.1 C)     Temp Source 12/04/21 1228 Oral     SpO2 12/04/21 1228 99 %     Weight 12/04/21 1228 220 lb 7.4 oz (100 kg)     Height 12/04/21 1228 '5\' 5"'$  (1.651 m)     Head Circumference --      Peak Flow --      Pain Score 12/04/21 1228 4     Pain Loc --      Pain Edu? --      Excl. in [place]? --     Most recent vital signs: Vitals:   12/04/21 1900 12/04/21 1930  BP: 127/64 (!) 111/58  Pulse: 80 81  Resp:  18  Temp:  98.1 F (36.7 C)  SpO2: 99% 100%    General: Awake, alert, oriented. CV:  Good peripheral perfusion. Regular rate and rhythm. Resp:  Normal effort. Lungs clear. Abd:  No distention. Tender to palpation in the RUQ and periumbilical region.    ED Results / Procedures / Treatments   Labs (all labs ordered are listed, but only abnormal results are displayed) Labs Reviewed  COMPREHENSIVE METABOLIC PANEL - Abnormal; Notable for the following components:      Result Value   Total Protein 8.4 (*)    AST 46 (*)    ALT 55 (*)    All other  components within normal limits  URINALYSIS, ROUTINE W REFLEX MICROSCOPIC - Abnormal; Notable for the following components:   Color, Urine AMBER (*)    APPearance CLOUDY (*)    Protein, ur 30 (*)    Leukocytes,Ua SMALL (*)    All other components within normal limits  LIPASE, BLOOD  CBC WITH DIFFERENTIAL/PLATELET  POC URINE PREG, ED     EKG  None   RADIOLOGY I independently interpreted and visualized the ct abd/pel. My interpretation: No free air Radiology interpretation:  IMPRESSION:  Thickened gallbladder wall; recommend correlation with ultrasound to  exclude acute cholecystitis.    Small umbilical hernia containing fat.    No other intra-abdominal or intrapelvic abnormalities.    I independently interpreted and visualized the RUQ [place]. My interpretation: Gallstones Radiology interpretation:  IMPRESSION:  Cholelithiasis with gallbladder wall thickening. Negative  sonographic Murphy sign. Recommend clinical correlation to exclude  acute cholecystitis.      PROCEDURES:  Critical Care performed: No  Procedures  MEDICATIONS ORDERED IN ED: Medications  iohexol (OMNIPAQUE) 300 MG/ML solution 100 mL (100 mLs Intravenous Contrast Given 12/04/21 1553)     IMPRESSION / MDM / ASSESSMENT AND PLAN / ED COURSE  I reviewed the triage vital signs and the nursing notes.                              Differential diagnosis includes, but is not limited to, GERD, diverticulitis, gallbladder disease, pancreatitis.   Patient's presentation is most consistent with acute presentation with potential threat to life or bodily function.  Patient presented to the emergency department today because of concerns for abdominal pain.  On exam she was tender in the right upper quadrant and periumbilical region.  CT scan was concerning for some gallbladder wall thickening.  Ultrasound again showed gallbladder wall thickening.  No pericholecystic fluid.  I did discuss with Dr. Lysle Pearl with the  surgery team who will plan on admission for further work up and evaluation.    FINAL CLINICAL IMPRESSION(S) / ED DIAGNOSES   Final diagnoses:  Abdominal pain, unspecified abdominal location      Note:  This document was prepared using Dragon voice recognition software and may include unintentional dictation errors.    Nance Pear, MD 12/04/21 2102

## 2021-12-04 NOTE — H&P (Addendum)
Subjective:   CC: LLQ pain  HPI:  Janet Franco is a 46 y.o. female who was consulted by Archie Balboa for issue above.  Symptoms were first noted several days ago. Pain is LLQ, constant, radiating from the left lower quadrant to flank, up to epigastric region Associated with nothing, exacerbated by nothing specific.      Past Medical History: HTN, DM  Past Surgical History: no abdominal surgery  Family History: family history includes Diabetes in her mother.  Social History:  reports that she has never smoked. She has never used smokeless tobacco. She reports current alcohol use. She reports that she does not use drugs.  Current Medications:  Prior to Admission medications   Medication Sig Start Date End Date Taking? Authorizing Provider  diclofenac (VOLTAREN) 75 MG EC tablet TAKE 1 TABLET BY MOUTH TWICE A DAY 09/29/18   Jearld Fenton, NP  ibuprofen (ADVIL,MOTRIN) 200 MG tablet Take 200-400 mg by mouth every 6 (six) hours as needed (for pain).    [provider]  Metformin lisinopril  Allergies:  Allergies as of 12/04/2021   (No Known Allergies)    ROS:  General: Denies weight loss, weight gain, fatigue, fevers, chills, and night sweats. Eyes: Denies blurry vision, double vision, eye pain, itchy eyes, and tearing. Ears: Denies hearing loss, earache, and ringing in ears. Nose: Denies sinus pain, congestion, infections, runny nose, and nosebleeds. Mouth/throat: Denies hoarseness, sore throat, bleeding gums, and difficulty swallowing. Heart: Denies chest pain, palpitations, racing heart, irregular heartbeat, leg pain or swelling, and decreased activity tolerance. Respiratory: Denies breathing difficulty, shortness of breath, wheezing, cough, and sputum. GI: Denies change in appetite, heartburn, nausea, vomiting, constipation, diarrhea, and blood in stool. GU: Denies difficulty urinating, pain with urinating, urgency, frequency, blood in urine. Musculoskeletal: Denies joint  stiffness, pain, swelling, muscle weakness. Skin: Denies rash, itching, mass, tumors, sores, and boils Neurologic: Denies headache, fainting, dizziness, seizures, numbness, and tingling. Psychiatric: Denies depression, anxiety, difficulty sleeping, and memory loss. Endocrine: Denies heat or cold intolerance, and increased thirst or urination. Blood/lymph: Denies easy bruising, easy bruising, and swollen glands     Objective:     BP 116/76   Pulse 79   Temp 98.1 F (36.7 C) (Oral)   Resp 18   Ht '5\' 5"'$  (1.651 m)   Wt 100 kg   SpO2 99%   BMI 36.69 kg/m   Constitutional :  alert, cooperative, appears stated age, and no distress  Lymphatics/Throat:  no asymmetry, masses, or scars  Respiratory:  clear to auscultation bilaterally  Cardiovascular:  regular rate and rhythm  Gastrointestinal: Soft, no guarding, NO TTP RUQ.  TTP in LLQ, along left flank.  Complains more of rebound tenderness on the left side than tenderness on palpation . No obvious palpable hernia on exam, but difficult due to body habitus  Musculoskeletal: Steady movement  Skin: Cool and moist, no surgical scars   Psychiatric: Normal affect, non-agitated, not confused       LABS:     Latest Ref Rng & Units 12/04/2021   12:31 PM 10/28/2016    4:21 PM 10/28/2016    1:56 PM  CMP  Glucose 70 - 99 mg/dL 94   114   BUN 6 - 20 mg/dL 8   6   Creatinine 0.44 - 1.00 mg/dL 0.46   0.46   Sodium 135 - 145 mmol/L 138   136   Potassium 3.5 - 5.1 mmol/L 4.5   3.8   Chloride 98 - 111  mmol/L 101   105   CO2 22 - 32 mmol/L 28   24   Calcium 8.9 - 10.3 mg/dL 9.5   8.9   Total Protein 6.5 - 8.1 g/dL 8.4  7.5    Total Bilirubin 0.3 - 1.2 mg/dL 0.5  0.8    Alkaline Phos 38 - 126 U/L 89  91    AST 15 - 41 U/L 46  17    ALT 0 - 44 U/L 55  19        Latest Ref Rng & Units 12/04/2021   12:31 PM 10/28/2016    1:56 PM 06/28/2013    9:07 PM  CBC  WBC 4.0 - 10.5 K/uL 6.7  9.9  7.8   Hemoglobin 12.0 - 15.0 g/dL 13.9  14.6  14.0    Hematocrit 36.0 - 46.0 % 42.8  43.2  41.2   Platelets 150 - 400 K/uL 281  274  252     RADS: CLINICAL DATA:  LEFT lower quadrant abdominal pain and nausea for 1 week, pain ratty, pain rated at 8/10   EXAM: CT ABDOMEN AND PELVIS WITH CONTRAST   TECHNIQUE: Multidetector CT imaging of the abdomen and pelvis was performed using the standard protocol following bolus administration of intravenous contrast.   RADIATION DOSE REDUCTION: This exam was performed according to the departmental dose-optimization program which includes automated exposure control, adjustment of the mA and/or kV according to patient size and/or use of iterative reconstruction technique.   CONTRAST:  170m OMNIPAQUE IOHEXOL 300 MG/ML  SOLN   COMPARISON:  10/28/2016   FINDINGS: Lower chest: Lung bases clear   Hepatobiliary: Thickened gallbladder wall. No definite calcifications or biliary dilatation. Liver normal appearance.   Pancreas: Normal appearance   Spleen: Normal appearance   Adrenals/Urinary Tract: Adrenal glands, kidneys, ureters, and bladder normal appearance   Stomach/Bowel: Normal appendix. Stomach and bowel loops normal appearance.   Vascular/Lymphatic: Vascular structures patent.  No adenopathy.   Reproductive: Normal appearing uterus and LEFT ovary. RIGHT ovarian cyst 3.6 x 3.0 cm image 82; no follow-up imaging recommended. Note: This recommendation does not apply to premenarchal patients and to those with increased risk (genetic, family history, elevated tumor markers or other high-risk factors) of ovarian cancer. Reference: JACR 2020 Feb; 17(2):248-254 Note: This recommendation does not apply to premenarchal patients and to those with increased risk (genetic, family history, elevated tumor markers or other high-risk factors) of ovarian cancer. Reference: JACR 2020 Feb; 17(2):248-254   Other: No free air or free fluid. Small umbilical hernia containing fat.   Musculoskeletal:  Unremarkable   IMPRESSION: Thickened gallbladder wall; recommend correlation with ultrasound to exclude acute cholecystitis.   Small umbilical hernia containing fat.   No other intra-abdominal or intrapelvic abnormalities.     Electronically Signed   By: MLavonia DanaM.D.   On: 12/04/2021 16:08   Assessment:   LLQ pain, unknown etiology.  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  Rebound tenderness is not consistent with hernia type pain, although location is similar.  GB wall thicekning and the increase LFTs also concerning for cholecystitis, but no TTP over the area.  Plan:   Will admit for pain control, HIDA scan to see if gallbladder etiology present for now.  Will also continue to monitor LLQ pain for possible incarcerated hernia?  CLD, pain control in the meantime.  DM- SSI HTN- home lisinopril  The patient verbalized understanding and all questions  were answered to the patient's satisfaction.  labs/images/medications/previous chart entries reviewed personally and relevant changes/updates noted above.

## 2021-12-04 NOTE — ED Provider Triage Note (Signed)
Emergency Medicine Provider Triage Evaluation Note  Janet Franco , a 46 y.o. female  was evaluated in triage.  Pt complains of left lower quadrant abdominal pain x5 days.  No vomiting or diarrhea.  Patient has decreased appetite..  Review of Systems  Positive: Abdominal pain Negative: Fever  Physical Exam  Pulse 88   Temp 98.7 F (37.1 C) (Oral)   Resp 17   Ht '5\' 5"'$  (1.651 m)   Wt 100 kg   SpO2 99%   BMI 36.69 kg/m  Gen:   Awake, no distress   Resp:  Normal effort  MSK:   Moves extremities without difficulty  Other:   tender in left lower quadrant across to the pubis  Medical Decision Making  Medically screening exam initiated at 12:30 PM.  Appropriate orders placed.  Simrin Vegh was informed that the remainder of the evaluation will be completed by another provider, this initial triage assessment does not replace that evaluation, and the importance of remaining in the ED until their evaluation is complete.  Labs and CT ordered   Versie Starks, PA-C 12/04/21 1230

## 2021-12-04 NOTE — ED Notes (Signed)
First Nurse Note: Patient sent to ED form [place] for left lower abd pain and nausea x1 week. 8/10 pain.

## 2021-12-04 NOTE — ED Triage Notes (Signed)
Abd pain x 5 days , no nausea, no vomiting lower abd pain

## 2021-12-04 NOTE — ED Provider Notes (Incomplete)
[facility] Provider Note    Event Date/Time   First MD Initiated Contact with Patient 12/04/21 1628     (approximate)   History   Abdominal Pain (X 5 days, no nausea, no vomiting )   HPI {Remember to add pertinent medical, surgical, social, and/or OB history to HPI:1} Janet Franco is a 46 y.o. female  ***       Physical Exam   Triage Vital Signs: ED Triage Vitals  Enc Vitals Group     BP 12/04/21 1502 132/83     Pulse Rate 12/04/21 1228 88     Resp 12/04/21 1228 17     Temp 12/04/21 1228 98.7 F (37.1 C)     Temp Source 12/04/21 1228 Oral     SpO2 12/04/21 1228 99 %     Weight 12/04/21 1228 220 lb 7.4 oz (100 kg)     Height 12/04/21 1228 '5\' 5"'$  (1.651 m)     Head Circumference --      Peak Flow --      Pain Score 12/04/21 1228 4     Pain Loc --      Pain Edu? --      Excl. in [place]? --     Most recent vital signs: Vitals:   12/04/21 1228 12/04/21 1502  BP:  132/83  Pulse: 88 82  Resp: 17 18  Temp: 98.7 F (37.1 C) 98.2 F (36.8 C)  SpO2: 99% 99%    {Only need to document appropriate and relevant physical exam:1} General: Awake, no distress. *** CV:  Good peripheral perfusion. *** Resp:  Normal effort. *** Abd:  No distention. *** Other:  ***   ED Results / Procedures / Treatments   Labs (all labs ordered are listed, but only abnormal results are displayed) Labs Reviewed  COMPREHENSIVE METABOLIC PANEL - Abnormal; Notable for the following components:      Result Value   Total Protein 8.4 (*)    AST 46 (*)    ALT 55 (*)    All other components within normal limits  URINALYSIS, ROUTINE W REFLEX MICROSCOPIC - Abnormal; Notable for the following components:   Color, Urine AMBER (*)    APPearance CLOUDY (*)    Protein, ur 30 (*)    Leukocytes,Ua SMALL (*)    All other components within normal limits  LIPASE, BLOOD  CBC WITH DIFFERENTIAL/PLATELET  POC URINE PREG, ED     EKG  ***   RADIOLOGY *** {USE THE WORD  "INTERPRETED"!! You MUST document your own interpretation of imaging, as well as the fact that you reviewed the radiologist's report!:1}   PROCEDURES:  Critical Care performed: {CriticalCareYesNo:19197::"Yes, see critical care procedure note(s)","No"}  Procedures   MEDICATIONS ORDERED IN ED: Medications  iohexol (OMNIPAQUE) 300 MG/ML solution 100 mL (100 mLs Intravenous Contrast Given 12/04/21 1553)     IMPRESSION / MDM / [place] / ED COURSE  I reviewed the triage vital signs and the nursing notes.                              Differential diagnosis includes, but is not limited to, ***  Patient's presentation is most consistent with {EM COPA:27473}  {If the patient is on the monitor, remove the brackets and asterisks on the sentence below and remember to document it as a Procedure as well. Otherwise delete the sentence below:1} {**The patient is on the cardiac  monitor to evaluate for evidence of arrhythmia and/or significant heart rate changes.**} {Remember to include, when applicable, any/all of the following data: independent review of imaging independent review of labs (comment specifically on pertinent positives and negatives) review of specific prior hospitalizations, PCP/specialist notes, etc. discuss meds given and prescribed document any discussion with consultants (including hospitalists) any clinical decision tools you used and why (PECARN, NEXUS, etc.) did you consider admitting the patient? document social determinants of health affecting patient's care (homelessness, inability to follow up in a timely fashion, etc) document any pre-existing conditions increasing risk on current visit (e.g. diabetes and HTN increasing danger of high-risk chest pain/ACS) describes what meds you gave (especially parenteral) and why any other interventions?:1}     FINAL CLINICAL IMPRESSION(S) / ED DIAGNOSES   Final diagnoses:  None     Rx / DC Orders   ED  Discharge Orders     None        Note:  This document was prepared using Dragon voice recognition software and may include unintentional dictation errors.

## 2021-12-05 ENCOUNTER — Other Ambulatory Visit: Payer: Self-pay

## 2021-12-05 ENCOUNTER — Encounter: Payer: Self-pay | Admitting: Surgery

## 2021-12-05 ENCOUNTER — Inpatient Hospital Stay: Payer: BLUE CROSS/BLUE SHIELD | Admitting: Registered Nurse

## 2021-12-05 ENCOUNTER — Encounter
Admission: EM | Disposition: A | Discharge status: Home / Self Care | Payer: Self-pay | Source: Home / Self Care | Attending: Surgery

## 2021-12-05 HISTORY — PX: XI ROBOTIC ASSISTED VENTRAL HERNIA: SHX6789

## 2021-12-05 HISTORY — PX: INSERTION OF MESH: SHX5868

## 2021-12-05 HISTORY — PX: INDOCYANINE GREEN FLUORESCENCE IMAGING (ICG): SHX7595

## 2021-12-05 LAB — CBC
HCT: 38.4 % (ref 36.0–46.0)
Hemoglobin: 12.4 g/dL (ref 12.0–15.0)
MCH: 29.8 pg (ref 26.0–34.0)
MCHC: 32.3 g/dL (ref 30.0–36.0)
MCV: 92.3 fL (ref 80.0–100.0)
Platelets: 283 10*3/uL (ref 150–400)
RBC: 4.16 MIL/uL (ref 3.87–5.11)
RDW: 13.9 % (ref 11.5–15.5)
WBC: 6.9 10*3/uL (ref 4.0–10.5)
nRBC: 0 % (ref 0.0–0.2)

## 2021-12-05 LAB — GLUCOSE, CAPILLARY
Glucose-Capillary: 125 mg/dL — ABNORMAL HIGH (ref 70–99)
Glucose-Capillary: 122 mg/dL — ABNORMAL HIGH (ref 70–99)
Glucose-Capillary: 188 mg/dL — ABNORMAL HIGH (ref 70–99)
Glucose-Capillary: 206 mg/dL — ABNORMAL HIGH (ref 70–99)

## 2021-12-05 LAB — BASIC METABOLIC PANEL
Anion gap: 3 — ABNORMAL LOW (ref 5–15)
BUN: 10 mg/dL (ref 6–20)
CO2: 27 mmol/L (ref 22–32)
Calcium: 8.4 mg/dL — ABNORMAL LOW (ref 8.9–10.3)
Chloride: 108 mmol/L (ref 98–111)
Creatinine, Ser: 0.5 mg/dL (ref 0.44–1.00)
GFR, Estimated: >60 mL/min (ref >60)
Glucose, Bld: 122 mg/dL — ABNORMAL HIGH (ref 70–99)
Potassium: 3.7 mmol/L (ref 3.5–5.1)
Sodium: 138 mmol/L (ref 135–145)

## 2021-12-05 LAB — CBG MONITORING, ED: Glucose-Capillary: 126 mg/dL — ABNORMAL HIGH (ref 70–99)

## 2021-12-05 LAB — HEMOGLOBIN A1C
Hgb A1c MFr Bld: 7.1 % — ABNORMAL HIGH (ref 4.8–5.6)
Mean Plasma Glucose: 157.07 mg/dL

## 2021-12-05 LAB — PHOSPHORUS: Phosphorus: 4.5 mg/dL (ref 2.5–4.6)

## 2021-12-05 LAB — LACTIC ACID, PLASMA: Lactic Acid, Venous: 0.9 mmol/L (ref 0.5–1.9)

## 2021-12-05 LAB — MAGNESIUM: Magnesium: 2.1 mg/dL (ref 1.7–2.4)

## 2021-12-05 SURGERY — CHOLECYSTECTOMY, ROBOT-ASSISTED, LAPAROSCOPIC
Anesthesia: General

## 2021-12-05 MED ORDER — SUGAMMADEX SODIUM 500 MG/5ML IV SOLN
INTRAVENOUS | Status: DC | PRN
  Administered 2021-12-05: 450 mg via INTRAVENOUS

## 2021-12-05 MED ORDER — HYDROMORPHONE HCL 1 MG/ML IJ SOLN
INTRAMUSCULAR | Status: AC
  Filled 2021-12-05: qty 1

## 2021-12-05 MED ORDER — INDOCYANINE GREEN 25 MG IV SOLR
1.2500 mg | Freq: Once | INTRAVENOUS | Status: AC
  Administered 2021-12-05: 1.25 mg via INTRAVENOUS
  Filled 2021-12-05: qty 0.5

## 2021-12-05 MED ORDER — FIBRIN SEALANT 2 ML SINGLE DOSE KIT
PACK | CUTANEOUS | Status: DC | PRN
  Administered 2021-12-05: 2 mL via TOPICAL

## 2021-12-05 MED ORDER — SEVOFLURANE IN SOLN
RESPIRATORY_TRACT | Status: AC
  Filled 2021-12-05: qty 250

## 2021-12-05 MED ORDER — ROCURONIUM BROMIDE 100 MG/10ML IV SOLN
INTRAVENOUS | Status: DC | PRN
  Administered 2021-12-05: 60 mg via INTRAVENOUS
  Administered 2021-12-05: 30 mg via INTRAVENOUS
  Administered 2021-12-05 (×2): 20 mg via INTRAVENOUS
  Administered 2021-12-05: 40 mg via INTRAVENOUS

## 2021-12-05 MED ORDER — DEXAMETHASONE SODIUM PHOSPHATE 10 MG/ML IJ SOLN
INTRAMUSCULAR | Status: AC
  Filled 2021-12-05: qty 1

## 2021-12-05 MED ORDER — DEXAMETHASONE SODIUM PHOSPHATE 10 MG/ML IJ SOLN
INTRAMUSCULAR | Status: DC | PRN
  Administered 2021-12-05: 4 mg via INTRAVENOUS

## 2021-12-05 MED ORDER — DOCUSATE SODIUM 100 MG PO CAPS: 100.0000 mg | ORAL_CAPSULE | Freq: Two times a day (BID) | ORAL | 0 refills | Status: AC | PRN

## 2021-12-05 MED ORDER — IBUPROFEN 800 MG PO TABS: 800.0000 mg | ORAL_TABLET | Freq: Three times a day (TID) | ORAL | 0 refills | Status: AC | PRN

## 2021-12-05 MED ORDER — CEFAZOLIN SODIUM-DEXTROSE 2-4 GM/100ML-% IV SOLN
2.0000 g | Freq: Once | INTRAVENOUS | Status: AC
  Administered 2021-12-05 (×2): 2 g via INTRAVENOUS

## 2021-12-05 MED ORDER — DEXMEDETOMIDINE HCL IN NACL 200 MCG/50ML IV SOLN
INTRAVENOUS | Status: DC | PRN
  Administered 2021-12-05: 8 ug via INTRAVENOUS
  Administered 2021-12-05: 4 ug via INTRAVENOUS

## 2021-12-05 MED ORDER — FENTANYL CITRATE (PF) 100 MCG/2ML IJ SOLN
INTRAMUSCULAR | Status: AC
  Filled 2021-12-05: qty 2

## 2021-12-05 MED ORDER — PROPOFOL 10 MG/ML IV BOLUS
INTRAVENOUS | Status: DC | PRN
  Administered 2021-12-05: 200 mg via INTRAVENOUS

## 2021-12-05 MED ORDER — LABETALOL HCL 5 MG/ML IV SOLN
INTRAVENOUS | Status: DC | PRN
  Administered 2021-12-05: 5 mg via INTRAVENOUS

## 2021-12-05 MED ORDER — LIDOCAINE-EPINEPHRINE (PF) 1 %-1:200000 IJ SOLN
INTRAMUSCULAR | Status: AC
  Filled 2021-12-05: qty 30

## 2021-12-05 MED ORDER — DEXMEDETOMIDINE HCL IN NACL 80 MCG/20ML IV SOLN
INTRAVENOUS | Status: AC
  Filled 2021-12-05: qty 20

## 2021-12-05 MED ORDER — ROCURONIUM BROMIDE 10 MG/ML (PF) SYRINGE
PREFILLED_SYRINGE | INTRAVENOUS | Status: AC
  Filled 2021-12-05: qty 10

## 2021-12-05 MED ORDER — LACTATED RINGERS IV SOLN: INTRAVENOUS | Status: DC | PRN

## 2021-12-05 MED ORDER — LIDOCAINE HCL (PF) 2 % IJ SOLN
INTRAMUSCULAR | Status: AC
  Filled 2021-12-05: qty 5

## 2021-12-05 MED ORDER — ACETAMINOPHEN 325 MG PO TABS: 650.0000 mg | ORAL_TABLET | Freq: Three times a day (TID) | ORAL | 0 refills | Status: AC | PRN

## 2021-12-05 MED ORDER — PROMETHAZINE HCL 25 MG/ML IJ SOLN: 6.2500 mg | INTRAMUSCULAR | Status: DC | PRN

## 2021-12-05 MED ORDER — OXYCODONE HCL 5 MG PO TABS: 5.0000 mg | ORAL_TABLET | Freq: Once | ORAL | Status: DC | PRN

## 2021-12-05 MED ORDER — FENTANYL CITRATE (PF) 100 MCG/2ML IJ SOLN
INTRAMUSCULAR | Status: DC | PRN
  Administered 2021-12-05 (×2): 50 ug via INTRAVENOUS

## 2021-12-05 MED ORDER — MIDAZOLAM HCL 2 MG/2ML IJ SOLN
INTRAMUSCULAR | Status: DC | PRN
  Administered 2021-12-05: 2 mg via INTRAVENOUS

## 2021-12-05 MED ORDER — LIDOCAINE HCL (CARDIAC) PF 100 MG/5ML IV SOSY
PREFILLED_SYRINGE | INTRAVENOUS | Status: DC | PRN
  Administered 2021-12-05: 60 mg via INTRAVENOUS

## 2021-12-05 MED ORDER — HYDROMORPHONE HCL 1 MG/ML IJ SOLN
INTRAMUSCULAR | Status: DC | PRN
  Administered 2021-12-05: .6 mg via INTRAVENOUS
  Administered 2021-12-05: .4 mg via INTRAVENOUS

## 2021-12-05 MED ORDER — MIDAZOLAM HCL 2 MG/2ML IJ SOLN
INTRAMUSCULAR | Status: AC
  Filled 2021-12-05: qty 2

## 2021-12-05 MED ORDER — DROPERIDOL 2.5 MG/ML IJ SOLN
0.6250 mg | Freq: Once | INTRAMUSCULAR | Status: AC | PRN
  Administered 2021-12-05: 0.625 mg via INTRAVENOUS

## 2021-12-05 MED ORDER — CEFAZOLIN SODIUM-DEXTROSE 2-4 GM/100ML-% IV SOLN
INTRAVENOUS | Status: AC
  Filled 2021-12-05: qty 100

## 2021-12-05 MED ORDER — DROPERIDOL 2.5 MG/ML IJ SOLN
INTRAMUSCULAR | Status: AC
  Filled 2021-12-05: qty 2

## 2021-12-05 MED ORDER — LIDOCAINE-EPINEPHRINE (PF) 1 %-1:200000 IJ SOLN
INTRAMUSCULAR | Status: DC | PRN
  Administered 2021-12-05: 31 mL via INTRAMUSCULAR
  Administered 2021-12-05: 29 mL via INTRAMUSCULAR

## 2021-12-05 MED ORDER — HYDROCODONE-ACETAMINOPHEN 5-325 MG PO TABS: 1.0000 | ORAL_TABLET | Freq: Four times a day (QID) | ORAL | 0 refills | Status: AC | PRN

## 2021-12-05 MED ORDER — SUCCINYLCHOLINE CHLORIDE 200 MG/10ML IV SOSY
PREFILLED_SYRINGE | INTRAVENOUS | Status: AC
  Filled 2021-12-05: qty 10

## 2021-12-05 MED ORDER — PROPOFOL 10 MG/ML IV BOLUS
INTRAVENOUS | Status: AC
  Filled 2021-12-05: qty 20

## 2021-12-05 MED ORDER — ACETAMINOPHEN 10 MG/ML IV SOLN: 1000.0000 mg | Freq: Once | INTRAVENOUS | Status: DC | PRN

## 2021-12-05 MED ORDER — BUPIVACAINE LIPOSOME 1.3 % IJ SUSP
INTRAMUSCULAR | Status: AC
  Filled 2021-12-05: qty 20

## 2021-12-05 MED ORDER — 0.9 % SODIUM CHLORIDE (POUR BTL) OPTIME
TOPICAL | Status: DC | PRN
  Administered 2021-12-05: 500 mL

## 2021-12-05 MED ORDER — ACETAMINOPHEN 10 MG/ML IV SOLN
INTRAVENOUS | Status: DC | PRN
  Administered 2021-12-05: 1000 mg via INTRAVENOUS

## 2021-12-05 MED ORDER — ACETAMINOPHEN 10 MG/ML IV SOLN
INTRAVENOUS | Status: AC
  Filled 2021-12-05: qty 100

## 2021-12-05 MED ORDER — OXYCODONE HCL 5 MG/5ML PO SOLN: 5.0000 mg | Freq: Once | ORAL | Status: DC | PRN

## 2021-12-05 MED ORDER — BUPIVACAINE HCL (PF) 0.5 % IJ SOLN
INTRAMUSCULAR | Status: AC
  Filled 2021-12-05: qty 30

## 2021-12-05 MED ORDER — FENTANYL CITRATE (PF) 100 MCG/2ML IJ SOLN
25.0000 ug | INTRAMUSCULAR | Status: DC | PRN
  Administered 2021-12-05 (×4): 25 ug via INTRAVENOUS

## 2021-12-05 SURGICAL SUPPLY — 82 items
ANCHOR TIS RET SYS 235ML (MISCELLANEOUS) ×2 IMPLANT
APPLICATOR VISTASEAL FLEXIBLE (MISCELLANEOUS) ×1 IMPLANT
BAG PRESSURE INF DISP 1000 (BAG) ×1 IMPLANT
BAG PRESSURE INF REUSE 1000 (BAG) IMPLANT
BLADE SURG SZ11 CARB STEEL (BLADE) ×2 IMPLANT
BULB RESERV EVAC DRAIN JP 100C (MISCELLANEOUS) ×1 IMPLANT
CANNULA REDUC XI 12-8 STAPL (CANNULA) ×2
CANNULA REDUCER 12-8 DVNC XI (CANNULA) ×2 IMPLANT
CATH REDDICK CHOLANGI 4FR 50CM (CATHETERS) IMPLANT
CLIP LIGATING HEMO O LOK GREEN (MISCELLANEOUS) ×2 IMPLANT
COVER TIP SHEARS 8 DVNC (MISCELLANEOUS) ×2 IMPLANT
COVER TIP SHEARS 8MM DA VINCI (MISCELLANEOUS) ×2
DERMABOND ADVANCED .7 DNX12 (GAUZE/BANDAGES/DRESSINGS) ×2 IMPLANT
DRAIN JP 15F RND TROCAR (DRAIN) ×1 IMPLANT
DRAPE 3/4 80X56 (DRAPES) ×2 IMPLANT
DRAPE ARM DVNC X/XI (DISPOSABLE) ×8 IMPLANT
DRAPE C-ARM XRAY 36X54 (DRAPES) IMPLANT
DRAPE COLUMN DVNC XI (DISPOSABLE) ×2 IMPLANT
DRAPE DA VINCI XI ARM (DISPOSABLE) ×8
DRAPE DA VINCI XI COLUMN (DISPOSABLE) ×2
ELECT CAUTERY BLADE 6.4 (BLADE) ×2 IMPLANT
ELECT REM PT RETURN 9FT ADLT (ELECTROSURGICAL) ×2
ELECTRODE REM PT RTRN 9FT ADLT (ELECTROSURGICAL) ×2 IMPLANT
GLOVE BIOGEL PI IND STRL 7.0 (GLOVE) ×4 IMPLANT
GLOVE SURG SYN 6.5 ES PF (GLOVE) ×4 IMPLANT
GLOVE SURG SYN 6.5 PF PI (GLOVE) ×2 IMPLANT
GOWN STRL REUS W/ TWL LRG LVL3 (GOWN DISPOSABLE) ×6 IMPLANT
GOWN STRL REUS W/TWL LRG LVL3 (GOWN DISPOSABLE) ×6
GRASPER SUT TROCAR 14GX15 (MISCELLANEOUS) IMPLANT
IRRIGATOR SUCT 8 DISP DVNC XI (IRRIGATION / IRRIGATOR) ×1 IMPLANT
IRRIGATOR SUCTION 8MM XI DISP (IRRIGATION / IRRIGATOR) ×2
IV NS 1000ML (IV SOLUTION) ×2
IV NS 1000ML BAXH (IV SOLUTION) ×1 IMPLANT
LABEL OR SOLS (LABEL) ×2 IMPLANT
MANIFOLD NEPTUNE II (INSTRUMENTS) ×2 IMPLANT
MESH 3DMAX MID 4X6 LT LRG (Mesh General) ×1 IMPLANT
NDL INSUFFLATION 14GA 120MM (NEEDLE) ×1 IMPLANT
NEEDLE HYPO 22GX1.5 SAFETY (NEEDLE) ×2 IMPLANT
NEEDLE INSUFFLATION 14GA 120MM (NEEDLE) ×2 IMPLANT
NS IRRIG 500ML POUR BTL (IV SOLUTION) ×3 IMPLANT
OBTURATOR OPTICAL LONG 8 DVNC (TROCAR) ×1 IMPLANT
OBTURATOR OPTICAL LONG 8MM (TROCAR) ×2
OBTURATOR OPTICAL STANDARD 8MM (TROCAR) ×2
OBTURATOR OPTICAL STND 8 DVNC (TROCAR) ×2
OBTURATOR OPTICALSTD 8 DVNC (TROCAR) ×2 IMPLANT
PACK LAP CHOLECYSTECTOMY (MISCELLANEOUS) ×2 IMPLANT
PENCIL SMOKE EVACUATOR (MISCELLANEOUS) ×2 IMPLANT
RELOAD STAPLE 45 3.5 BLU DVNC (STAPLE) IMPLANT
RELOAD STAPLER 3.5X45 BLU DVNC (STAPLE) ×2 IMPLANT
SEAL CANN UNIV 5-8 DVNC XI (MISCELLANEOUS) ×6 IMPLANT
SEAL XI 5MM-8MM UNIVERSAL (MISCELLANEOUS) ×6
SET TUBE SMOKE EVAC HIGH FLOW (TUBING) ×2 IMPLANT
SOLUTION ELECTROLUBE (MISCELLANEOUS) ×2 IMPLANT
SPIKE FLUID TRANSFER (MISCELLANEOUS) ×4 IMPLANT
STAPLER 45 DA VINCI SURE FORM (STAPLE) ×2
STAPLER 45 SUREFORM DVNC (STAPLE) ×1 IMPLANT
STAPLER CANNULA SEAL DVNC XI (STAPLE) ×2 IMPLANT
STAPLER CANNULA SEAL XI (STAPLE) ×2
STAPLER RELOAD 3.5X45 BLU DVNC (STAPLE) ×2
STAPLER RELOAD 3.5X45 BLUE (STAPLE) ×2
SUT ETHILON 3-0 FS-10 30 BLK (SUTURE) ×2
SUT MNCRL 4-0 (SUTURE) ×4
SUT MNCRL 4-0 27XMFL (SUTURE) ×4
SUT MNCRL AB 4-0 PS2 18 (SUTURE) ×3 IMPLANT
SUT STRATAFIX 0 PDS+ CT-2 23 (SUTURE)
SUT V-LOC 90 ABS DVC 3-0 CL (SUTURE) ×2 IMPLANT
SUT VIC AB 2-0 SH 27 (SUTURE) ×4
SUT VIC AB 2-0 SH 27XBRD (SUTURE) ×2 IMPLANT
SUT VIC AB 3-0 SH 27 (SUTURE) ×2
SUT VIC AB 3-0 SH 27X BRD (SUTURE) ×2 IMPLANT
SUT VICRYL 0 AB UR-6 (SUTURE) ×2 IMPLANT
SUT VLOC 90 6 CV-15 VIOLET (SUTURE) ×1 IMPLANT
SUTURE EHLN 3-0 FS-10 30 BLK (SUTURE) ×1 IMPLANT
SUTURE MNCRL 4-0 27XMF (SUTURE) ×4 IMPLANT
SUTURE STRATFX 0 PDS+ CT-2 23 (SUTURE) ×1 IMPLANT
SYR 30ML LL (SYRINGE) ×2 IMPLANT
SYSTEM WECK SHIELD CLOSURE (TROCAR) ×1 IMPLANT
TRAP FLUID SMOKE EVACUATOR (MISCELLANEOUS) ×2 IMPLANT
TRAY FOLEY MTR SLVR 16FR STAT (SET/KITS/TRAYS/PACK) ×1 IMPLANT
TROCAR 5M 150ML BLDLS (TROCAR) ×1 IMPLANT
TROCAR XCEL NON-BLD 5MMX100MML (ENDOMECHANICALS) IMPLANT
WATER STERILE IRR 500ML POUR (IV SOLUTION) ×2 IMPLANT

## 2021-12-05 NOTE — Anesthesia Procedure Notes (Signed)
Procedure Name: Intubation Date/Time: 12/05/2021 1:11 PM  Performed by: Sybilla Malhotra, CRNAPre-anesthesia Checklist: Patient identified, Patient being monitored, Timeout performed, Emergency Drugs available and Suction available Patient Re-evaluated:Patient Re-evaluated prior to induction Oxygen Delivery Method: Circle system utilized Preoxygenation: Pre-oxygenation with 100% oxygen Induction Type: IV induction Ventilation: Mask ventilation without difficulty Laryngoscope Size: Miller and 2 Grade View: Grade I Tube type: Oral Tube size: 7.0 mm Number of attempts: 1 Airway Equipment and Method: Stylet Placement Confirmation: ETT inserted through vocal cords under direct vision, positive ETCO2 and breath sounds checked- equal and bilateral Secured at: 20 cm Tube secured with: Tape Dental Injury: Teeth and Oropharynx as per pre-operative assessment

## 2021-12-05 NOTE — Discharge Instructions (Signed)
Lap chole and Hernia repair, Care After This sheet gives you information about how to care for yourself after your procedure. Your health care provider may also give you more specific instructions. If you have problems or questions, contact your health care provider. What can I expect after the procedure? After your procedure, it is common to have the following: Pain in your abdomen, especially in the incision areas. You will be given medicine to control the pain. Tiredness. This is a normal part of the recovery process. Your energy level will return to normal over the next several weeks. Changes in your bowel movements, such as constipation or needing to go more often. Talk with your health care provider about how to manage this. Follow these instructions at home: Medicines  tylenol and advil as needed for discomfort.  Please alternate between the two every four hours as needed for pain.    Use narcotics, if prescribed, only when tylenol and motrin is not enough to control pain.  325-'650mg'$  every 8hrs to max of '3000mg'$ /24hrs (including the '325mg'$  in every norco dose) for the tylenol.    Advil up to '800mg'$  per dose every 8hrs as needed for pain.   PLEASE RECORD NUMBER OF PILLS TAKEN UNTIL NEXT FOLLOW UP APPT.  THIS WILL HELP DETERMINE HOW READY YOU ARE TO BE RELEASED FROM ANY ACTIVITY RESTRICTIONS Do not drive or use heavy machinery while taking prescription pain medicine. Do not drink alcohol while taking prescription pain medicine.  Incision care    Follow instructions from your health care provider about how to take care of your incision areas. Make sure you: Keep your incisions clean and dry. Wash your hands with soap and water before and after applying medicine to the areas, and before and after changing your bandage (dressing). If soap and water are not available, use hand sanitizer. Change your dressing as told by your health care provider. Leave stitches (sutures), skin glue, or adhesive  strips in place. These skin closures may need to stay in place for 2 weeks or longer. If adhesive strip edges start to loosen and curl up, you may trim the loose edges. Do not remove adhesive strips completely unless your health care provider tells you to do that. Do not wear tight clothing over the incisions. Tight clothing may rub and irritate the incision areas, which may cause the incisions to open. Do not take baths, swim, or use a hot tub until your health care provider approves. OK TO SHOWER IN 24HRS.   Check your incision area every day for signs of infection. Check for: More redness, swelling, or pain. More fluid or blood. Warmth. Pus or a bad smell. Activity Avoid lifting anything that is heavier than 10 lb (4.5 kg) for 2 weeks or until your health care provider says it is okay. No pushing/pulling greater than 30lbs You may resume normal activities as told by your health care provider. Ask your health care provider what activities are safe for you. Take rest breaks during the day as needed. Eating and drinking Follow instructions from your health care provider about what you can eat after surgery. To prevent or treat constipation while you are taking prescription pain medicine, your health care provider may recommend that you: Drink enough fluid to keep your urine clear or pale yellow. Take over-the-counter or prescription medicines. Eat foods that are high in fiber, such as fresh fruits and vegetables, whole grains, and beans. Limit foods that are high in fat and processed sugars, such  as fried and sweet foods. General instructions Ask your health care provider when you will need an appointment to get your sutures or staples removed. Keep all follow-up visits as told by your health care provider. This is important. Contact a health care provider if: You have more redness, swelling, or pain around your incisions. You have more fluid or blood coming from the incisions. Your  incisions feel warm to the touch. You have pus or a bad smell coming from your incisions or your dressing. You have a fever. You have an incision that breaks open (edges not staying together) after sutures or staples have been removed. You develop a rash. You have chest pain or difficulty breathing. You have pain or swelling in your legs. You feel light-headed or you faint. Your abdomen swells (becomes distended). You have nausea or vomiting. You have blood in your stool (feces). This information is not intended to replace advice given to you by your health care provider. Make sure you discuss any questions you have with your health care provider. Document Released: 09/12/2004 Document Revised: 11/12/2017 Document Reviewed: 11/25/2015 Elsevier Interactive Patient Education  2019 [employer].

## 2021-12-05 NOTE — Progress Notes (Deleted)
Discharge instructions were reviewed with pt's daughter. Questions were encourage and answered. IV was taken out. VSS. Physical assessment was done. No c/o at this time.

## 2021-12-05 NOTE — Progress Notes (Signed)
Subjective:  CC: Janet Franco is a 46 y.o. female  Hospital stay day 1,   LLQ pain  HPI: No acute changes overnight.  Feeling better with morphine  ROS:  General: Denies weight loss, weight gain, fatigue, fevers, chills, and night sweats. Heart: Denies chest pain, palpitations, racing heart, irregular heartbeat, leg pain or swelling, and decreased activity tolerance. Respiratory: Denies breathing difficulty, shortness of breath, wheezing, cough, and sputum. GI: Denies change in appetite, heartburn, nausea, vomiting, constipation, diarrhea, and blood in stool. GU: Denies difficulty urinating, pain with urinating, urgency, frequency, blood in urine.   Objective:   Temp:  [98.1 F (36.7 C)-98.7 F (37.1 C)] 98.1 F (36.7 C) (09/29 0450) Pulse Rate:  [73-89] 73 (09/29 0700) Resp:  [14-18] 14 (09/29 0700) BP: (107-138)/(55-83) 107/60 (09/29 0700) SpO2:  [95 %-100 %] 97 % (09/29 0700) Weight:  [100 kg] 100 kg (09/28 1228)     Height: '5\' 5"'$  (165.1 cm) Weight: 100 kg BMI (Calculated): 36.69   Intake/Output this shift:  No intake or output data in the 24 hours ending 12/05/21 0745  Constitutional :  alert, cooperative, appears stated age, and no distress  Respiratory:  clear to auscultation bilaterally  Cardiovascular:  regular rate and rhythm  Gastrointestinal: Soft, no guarding, decreased TTP in LLQ, still NO RUQ TTP.  Resolved epigastric pain .   Skin: Cool and moist.   Psychiatric: Normal affect, non-agitated, not confused       LABS:     Latest Ref Rng & Units 12/05/2021    4:48 AM 12/04/2021   12:31 PM 10/28/2016    4:21 PM  CMP  Glucose 70 - 99 mg/dL 122  94    BUN 6 - 20 mg/dL 10  8    Creatinine 0.44 - 1.00 mg/dL 0.50  0.46    Sodium 135 - 145 mmol/L 138  138    Potassium 3.5 - 5.1 mmol/L 3.7  4.5    Chloride 98 - 111 mmol/L 108  101    CO2 22 - 32 mmol/L 27  28    Calcium 8.9 - 10.3 mg/dL 8.4  9.5    Total Protein 6.5 - 8.1 g/dL  8.4  7.5   Total Bilirubin 0.3 -  1.2 mg/dL  0.5  0.8   Alkaline Phos 38 - 126 U/L  89  91   AST 15 - 41 U/L  46  17   ALT 0 - 44 U/L  55  19       Latest Ref Rng & Units 12/05/2021    4:48 AM 12/04/2021   12:31 PM 10/28/2016    1:56 PM  CBC  WBC 4.0 - 10.5 K/uL 6.9  6.7  9.9   Hemoglobin 12.0 - 15.0 g/dL 12.4  13.9  14.6   Hematocrit 36.0 - 46.0 % 38.4  42.8  43.2   Platelets 150 - 400 K/uL 283  281  274     RADS: N/a Assessment:   LLQ abdominal pain.  At this point, I doubt HIDA will be of any benefit due to even more localized LLQ pain than previous exam. Wbc and lactate remain WNL.    I discussed only definitive way to obtain diagnosis and also treat pain will be diagnostic laparoscopy.  If hernia is indeed noted, repair with mesh.  If gallbladder is inflammed as noted on previous exam, proceeding with lap chole at same time, understanding there will be increased chance of infectious risk.  Pt verbalized understanding  and wishes to proceed with both.  Will cancel HIDA.    Discussed the risk of chole surgery including post-op infxn, seroma, biloma, chronic pain, poor-delayed wound healing, retained gallstone, conversion to open procedure, post-op SBO or ileus, and need for additional procedures to address said risks.  The risks of general anesthetic including MI, CVA, sudden death or even reaction to anesthetic medications also discussed. Alternatives include continued observation.  Benefits include possible symptom relief, prevention of complications including acute cholecystitis, pancreatitis.  Typical post operative recovery of 3-5 days rest, continued pain in area and incision sites, possible loose stools up to 4-6 weeks, also discussed.  Discussed the risk of hernia surgery including recurrence, which can be up to 50% in the case of incisional or complex hernias, possible use of prosthetic materials (mesh) and the increased risk of mesh infxn if used, bleeding, chronic pain, post-op infxn, post-op SBO or ileus, and  possible re-operation to address said risks. The risks of general anesthetic, if used, includes MI, CVA, sudden death or even reaction to anesthetic medications also discussed. Alternatives include continued observation.  Benefits include possible symptom relief, prevention of incarceration, strangulation, enlargement in size over time, and the risk of emergency surgery in the face of strangulation.  Typical post-op recovery time of 3-5 days with 4-6 weeks of activity restrictions were also discussed.  The patient verbalized understanding and all questions were answered to the patient's satisfaction. Encounter done via interpreter  labs/images/medications/previous chart entries reviewed personally and relevant changes/updates noted above.

## 2021-12-05 NOTE — Progress Notes (Signed)
End of shift note:  Pt had lap chole and JP drain placement. She was post up at [place]. Pt was placed on 2 L [place] because her SpO2 on RA was 86%. Family is at the bedside and plan of care was reviewed with them.

## 2021-12-05 NOTE — Transfer of Care (Signed)
Immediate Anesthesia Transfer of Care Note  Patient: Kathy Wahid  Procedure(s) Performed: XI ROBOTIC ASSISTED LAPAROSCOPIC CHOLECYSTECTOMY INDOCYANINE GREEN FLUORESCENCE IMAGING (ICG) XI ROBOTIC ASSISTED VENTRAL HERNIA INSERTION OF MESH (Left: Pelvis)  Patient Location: PACU  Anesthesia Type:General  Level of Consciousness: drowsy  Airway & Oxygen Therapy: Patient Spontanous Breathing and Patient connected to face mask oxygen  Post-op Assessment: Report given to RN and Post -op Vital signs reviewed and stable  Post vital signs: Reviewed and stable  Last Vitals:  Vitals Value Taken Time  BP 135/73 12/05/21 1730  Temp 36.3 C 12/05/21 1720  Pulse 99 12/05/21 1742  Resp 19 12/05/21 1742  SpO2 95 % 12/05/21 1742  Vitals shown include unvalidated device data.  Last Pain:  Vitals:   12/05/21 1720  TempSrc:   PainSc: Asleep         Complications: No notable events documented.

## 2021-12-05 NOTE — Progress Notes (Signed)
Report was given to Pre op nurse.

## 2021-12-05 NOTE — Op Note (Signed)
Preoperative diagnosis: left spegelian Hernia, initial, incarcerated and acute cholecystitis  Postoperative diagnosis: same  Procedure: Robotic assisted laparoscopic left spegelian hernia repair with mesh, laparoscopic cholecystectomy  Anesthesia: General  Surgeon: Dr. Lysle Pearl  Wound Classification: Clean  Specimen: none  Complications: None  Estimated Blood Loss: 32m   Indications:  See H&P for further details.  Findings: 1. Spegelian hernia and acute cholecystitis 2. Bard 3D max medium weight mesh used for repair 3.Critical view of safety noted 4. Cystic duct and artery identified, ligated and divided, clips remained intact at end of procedure 5. Adequate hemostasis achieved  Description of procedure: The patient was taken to the operating room. A time-out was completed verifying correct patient, procedure, site, positioning, and implant(s) and/or special equipment prior to beginning this procedure.  Area was prepped and draped in the usual sterile fashion.   Veress needle inserted at palmer's point.  Due to abdominal wall depth, Veress needle could not achieve pneumoperitoneum.  Incision extended at Palmer's point and 5 mm XL port used to gain access into peritoneal the Optiview technique.  15 mm pressure achieved without any issues and next inspection of the area noted no injury.  Inspection of the abdomen did confirm acute cholecystitis as well as a spigelian hernia in the left lower quadrant.  Local anesthesia  infused to the preplanned incision sites prior to insertion of the port.  Under direct vision, ports were placed in the following locations: one 875mport periumbilical, One 12 mm patient left of the umbilicus, 8cm from the initial  port, one 8 mm port placed to the patient right of the umbilical port 8 cm apart.  1 additional 8 mm port placed lateral to the 1279mort.  Once ports were placed, The table was placed in the Trendelenburg position. The Xi platform was brought  into the operative field and docked to the ports successfully.   The 4x4cm left lower abdominal spegelian hernia addressed first. A peritoneal flap was created approximately 8cm cephalad to the defect by using scissors with electrocautery.  Dissection was carried down towards the pubic tubercle, developing the myopectineal orifice view.  Laterally the flap was carried towards the ASIS.  large hernia sac and adjacent lipoma was noted, which carefully dissected away from the adjacent tissues to be fully reduced out of hernia cavity.  Any bleeding was controlled with combination of electrocautery and manual pressure.    After confirming adequate dissection and the peritoneal reflection completely down and away a Large Bard 3DMax medium weight mesh was placed within the anterior abdominal wall, secured in place using 2-0 Vicryl on an SH needle to rectus sheath  After noting proper placement of the mesh with the peritoneal reflection deep to it, the previously created peritoneal flap was secured back up to the anterior abdominal wall using running 3-0 V-Lock.  Additional 2-0 Vicryl was used to close peritoneal tears developed.  The flap formation.  All needles were then removed out of the abdominal cavity, Xi platform undocked from the ports and repositioned in preparation for the lap chole portion of the procedure.  After docking the robot, an endoscope was placed through the umbilical port, fenestrated grasper through the adjacent patient right port, prograsp to the far patient left port, and then a hook cautery in the left port. The dome of the gallbladder was grasped with prograsp, passed and retracted over the dome of the liver.  Dense adhesions between the gallbladder and omentum, duodenum and transverse colon were lysed via hook  cautery. The infundibulum was grasped with the fenestrated grasper and retracted toward the right lower quadrant. This maneuver exposed Calot's triangle. The peritoneum overlying the  gallbladder infundibulum was then eventually dissected away and extremely short and large cystic duct and cystic artery identified.  Critical view of safety with the liver bed clearly visible behind the duct and artery with no additional structures noted.  ICG confirmed location of the common bile duct which had to be dissected circumferentially in order to gain access to the cystic duct.  Due to the enlarged cystic duct and proximity to the common bile duct, 45 mm blue load stapler was used to transect it.  Care was noted to ensure common bile duct was not involved within the staple line.  Cystic artery clipped and divided close to the gallbladder prior to using the staple for better visualization.   The gallbladder was then dissected from its peritoneal and liver bed attachments by electrocautery.  Staple line was covered with Vistaseal for additional security.  Gallbladder was then placed in an Endo Catch bag.  15 [place] Blake drain placed through right lower quadrant port and placed within the gallbladder fossa area, secured to the skin using 3-0 nylon.  Endo Catch bag was then removed from the abdominal cavity after extending the left upper quadrant incision.  The gallbladder was passed off the table as a specimen. There was no evidence of bleeding from the gallbladder fossa or cystic artery or leakage of the bile from the cystic duct stump. The 12 mm port site closed with Efx shield using 0 vicryl under direct vision.  Abdomen desufflated and secondary trocars were removed under direct vision. No bleeding was noted. All skin incisions then closed with subcuticular sutures of 4-0 monocryl and dressed with topical skin adhesive.  JP drain site secured with drain sponge and tape.  The orogastric tube was removed and patient extubated.  The patient tolerated the procedure well and was taken to the postanesthesia care unit in stable condition.  All sponge and instrument count correct at end of procedure.

## 2021-12-05 NOTE — Anesthesia Preprocedure Evaluation (Addendum)
Anesthesia Evaluation  Patient identified by MRN, date of birth, ID band Patient awake    Reviewed: Allergy & Precautions, NPO status , Patient's Chart, lab work & pertinent test results  Airway Mallampati: III  TM Distance: >3 FB Neck ROM: full    Dental  (+) Poor Dentition Lower teeth poor dentition:   Pulmonary neg pulmonary ROS,    Pulmonary exam normal        Cardiovascular negative cardio ROS Normal cardiovascular exam     Neuro/Psych negative neurological ROS  negative psych ROS   GI/Hepatic Neg liver ROS,  Acute Cholecycitis   Endo/Other  diabetes, Type 2Morbid obesity  Renal/GU      Musculoskeletal   Abdominal (+) + obese,   Peds  Hematology negative hematology ROS (+)   Anesthesia Other Findings BMI    Body Mass Index: 36.69 kg/m     Reproductive/Obstetrics negative OB ROS                          Anesthesia Physical Anesthesia Plan  ASA: 3  Anesthesia Plan: General ETT   Post-op Pain Management: Toradol IV (intra-op)* and Ofirmev IV (intra-op)*   Induction: Intravenous  PONV Risk Score and Plan: 4 or greater and Ondansetron, Dexamethasone, Midazolam and Droperidol  Airway Management Planned: Oral ETT  Additional Equipment:   Intra-op Plan:   Post-operative Plan: Extubation in OR  Informed Consent: I have reviewed the patients History and Physical, chart, labs and discussed the procedure including the risks, benefits and alternatives for the proposed anesthesia with the patient or authorized representative who has indicated his/her understanding and acceptance.     Dental Advisory Given  Plan Discussed with: Anesthesiologist, CRNA and Surgeon  Anesthesia Plan Comments:       Anesthesia Quick Evaluation

## 2021-12-05 NOTE — TOC Initial Note (Signed)
Transition of Care [facility]) - Initial/Assessment Note    Patient Details  Name: Libbi Towner MRN: [id number] Date of Birth: Sep 26, 1975  Transition of Care [facility]) CM/SW Contact:    Beverly Sessions, RN Phone Number: 12/05/2021, 9:58 AM  Clinical Narrative:                     Transition of Care [facility]) Screening Note   Patient Details  Name: Airlie Blumenberg Date of Birth: [birth year]   Transition of Care [facility]) CM/SW Contact:    Beverly Sessions, RN Phone Number: 12/05/2021, 9:58 AM    Transition of Care Department [facility]) has reviewed patient and no TOC needs have been identified at this time. We will continue to monitor patient advancement through interdisciplinary progression rounds. If new patient transition needs arise, please place a TOC consult.       Patient Goals and CMS Choice        Expected Discharge Plan and Services                                                Prior Living Arrangements/Services                       Activities of Daily Living Home Assistive Devices/Equipment: None ADL Screening (condition at time of admission) Patient's cognitive ability adequate to safely complete daily activities?: Yes Is the patient deaf or have difficulty hearing?: No Does the patient have difficulty seeing, even when wearing glasses/contacts?: No Does the patient have difficulty concentrating, remembering, or making decisions?: No Patient able to express need for assistance with ADLs?: Yes Does the patient have difficulty dressing or bathing?: No Independently performs ADLs?: Yes (appropriate for developmental age) Does the patient have difficulty walking or climbing stairs?: No Weakness of Legs: None Weakness of Arms/Hands: None  Permission Sought/Granted                  Emotional Assessment              Admission diagnosis:  Abdominal pain [R10.9] Abdominal pain, unspecified abdominal location [R10.9] Patient Active Problem  List   Diagnosis Date Noted   Abdominal pain 12/04/2021   PCP:  Pcp, No Pharmacy:   [id number] [place] [place]Phone: [phone]: [phone]    Social Determinants of Health (SDOH) Interventions    Readmission Risk Interventions     No data to display

## 2021-12-06 ENCOUNTER — Encounter: Payer: Self-pay | Admitting: Surgery

## 2021-12-06 LAB — GLUCOSE, CAPILLARY: Glucose-Capillary: 124 mg/dL — ABNORMAL HIGH (ref 70–99)

## 2021-12-06 LAB — HIV ANTIBODY (ROUTINE TESTING W REFLEX): HIV Screen 4th Generation wRfx: NONREACTIVE

## 2021-12-06 NOTE — Progress Notes (Signed)
Discharge instructions were reviewed with pt and her family. Questions were answered and encourage. Teaching about how to monitor Jp's output was give to pt. IV was taken out.

## 2021-12-06 NOTE — Discharge Summary (Signed)
  Patient ID: Janet Franco MRN: [id number] DOB/AGE: 46-Sep-1977 46 y.o.  Admit date: 12/04/2021 Discharge date: 12/06/2021   Discharge Diagnoses:  Principal Problem:   Abdominal pain   Procedures: Robotic assisted laparoscopic cholecystectomy and ventral hernia repair  Hospital Course: Patient admitted with abdominal pain.  Initially abdominal pain mostly on the left lower quadrant.  Patient did present with acute cholecystitis.  Due to the acute cholecystitis and the pain mainly on the left lower quadrant she had a cholecystectomy and repair of incisional hernia as a possible cause of pain as well.  This morning the patient is feeling very comfortable.  Pain controlled.  Patient tolerating diet.  Patient ambulating.  Incisions are dry and clean.  Drain in place with serosanguineous output.  No sign of complications.  Physical Exam Vitals reviewed.  Constitutional:      Appearance: She is well-developed.  HENT:     Head: Normocephalic.  Cardiovascular:     Rate and Rhythm: Normal rate and regular rhythm.  Pulmonary:     Effort: Pulmonary effort is normal.  Abdominal:     General: Abdomen is flat. Bowel sounds are normal.     Palpations: Abdomen is soft.  Skin:    General: Skin is warm.     Capillary Refill: Capillary refill takes less than 2 seconds.  Neurological:     Mental Status: She is alert and oriented to person, place, and time.      Consults: None  Disposition: Discharge disposition: 01-Home or Self Care       Discharge Instructions     Diet - low sodium heart healthy   Complete by: As directed    Increase activity slowly   Complete by: As directed       Allergies as of 12/06/2021   No Known Allergies      Medication List     STOP taking these medications    diclofenac 75 MG EC tablet Commonly known as: VOLTAREN       TAKE these medications    acetaminophen 325 MG tablet Commonly known as: Tylenol Take 2 tablets (650 mg total) by mouth  every 8 (eight) hours as needed for mild pain.   docusate sodium 100 MG capsule Commonly known as: Colace Take 1 capsule (100 mg total) by mouth 2 (two) times daily as needed for up to 10 days for mild constipation.   glipiZIDE 5 MG tablet Commonly known as: GLUCOTROL Take 5 mg by mouth 2 (two) times daily.   HYDROcodone-acetaminophen 5-325 MG tablet Commonly known as: Norco Take 1 tablet by mouth every 6 (six) hours as needed for up to 6 doses for moderate pain.   ibuprofen 800 MG tablet Commonly known as: ADVIL Take 1 tablet (800 mg total) by mouth every 8 (eight) hours as needed for mild pain or moderate pain. What changed:  medication strength how much to take when to take this reasons to take this   lisinopril 20 MG tablet Commonly known as: ZESTRIL Take 20 mg by mouth daily.   metFORMIN 1000 MG tablet Commonly known as: GLUCOPHAGE Take 1,000 mg by mouth 2 (two) times daily.        Follow-up Information     Sakai, Isami, DO Follow up in 1 week(s).   Specialty: Surgery Why: post op lap chole and hernia repair, possible drain removal Contact information: [address] [place] [address] [phone]

## 2021-12-08 NOTE — Anesthesia Postprocedure Evaluation (Signed)
Anesthesia Post Note  Patient: Janet Franco  Procedure(s) Performed: XI ROBOTIC ASSISTED LAPAROSCOPIC CHOLECYSTECTOMY INDOCYANINE GREEN FLUORESCENCE IMAGING (ICG) XI ROBOTIC ASSISTED VENTRAL HERNIA INSERTION OF MESH (Left: Pelvis)  Patient location during evaluation: PACU Anesthesia Type: General Level of consciousness: awake and alert Pain management: pain level controlled Vital Signs Assessment: post-procedure vital signs reviewed and stable Respiratory status: spontaneous breathing, nonlabored ventilation, respiratory function stable and patient connected to nasal cannula oxygen Cardiovascular status: blood pressure returned to baseline and stable Postop Assessment: no apparent nausea or vomiting Anesthetic complications: no   No notable events documented.   Last Vitals:  Vitals:   12/06/21 0307 12/06/21 0811  BP: 131/76 114/64  Pulse: 81 83  Resp: 20 16  Temp: 36.7 C 36.8 C  SpO2: 94% 92%    Last Pain:  Vitals:   12/06/21 0937  TempSrc:   PainSc: 3                  Molli Barrows

## 2021-12-09 LAB — SURGICAL PATHOLOGY

## 2022-02-16 IMAGING — MG MM DIGITAL SCREENING BILAT W/ TOMO AND CAD
6 of 12 series · 6 of 36 positions shown · non-contrast
Comparison: Previous exam(s).

CLINICAL DATA: Screening.

EXAM:
DIGITAL SCREENING BILATERAL MAMMOGRAM WITH TOMOSYNTHESIS AND CAD
TECHNIQUE: Bilateral screening digital craniocaudal and mediolateral oblique
mammograms were obtained. Bilateral screening digital breast
tomosynthesis was performed. The images were evaluated with
computer-aided detection.

[R MLO synth-2D (1 of 2)]
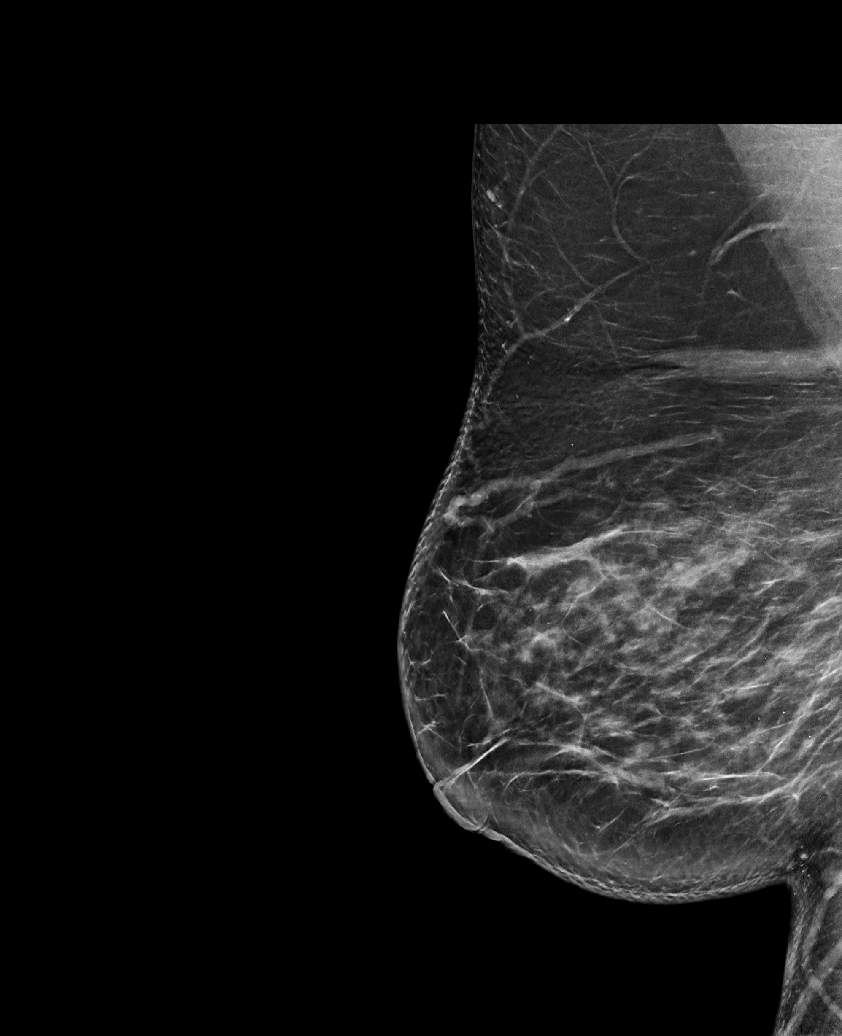

[L MLO synth-2D (1 of 2)]
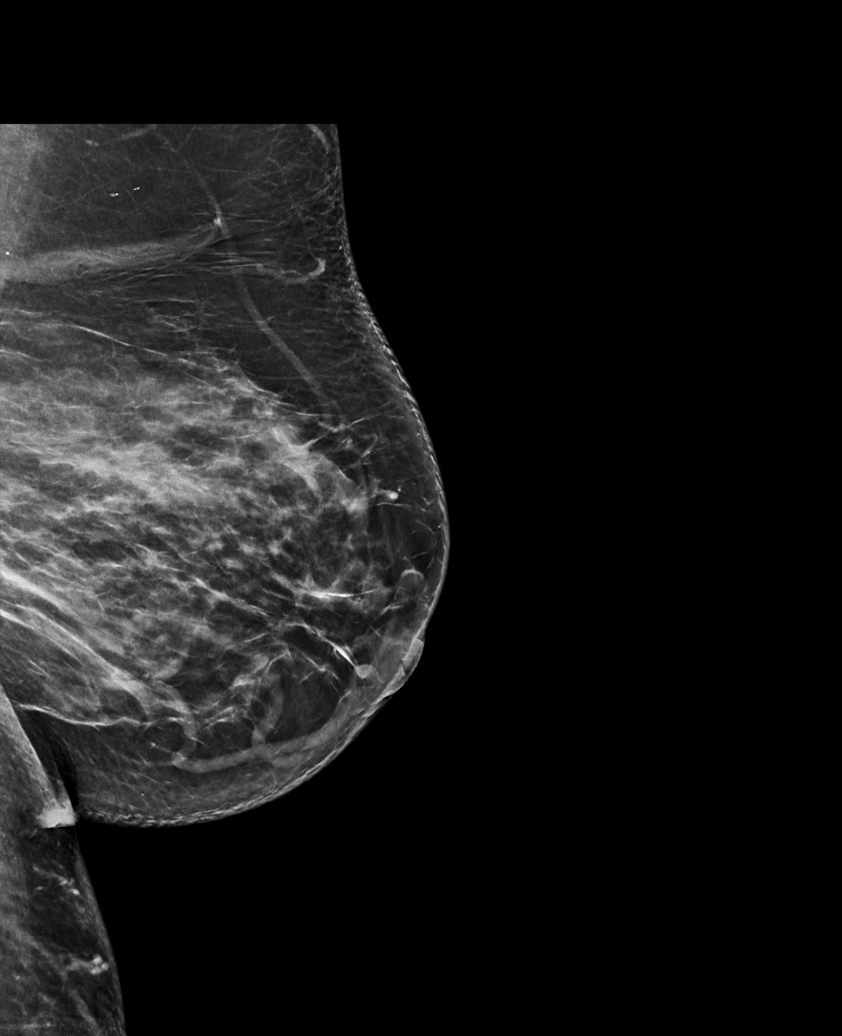

[R CC synth-2D]
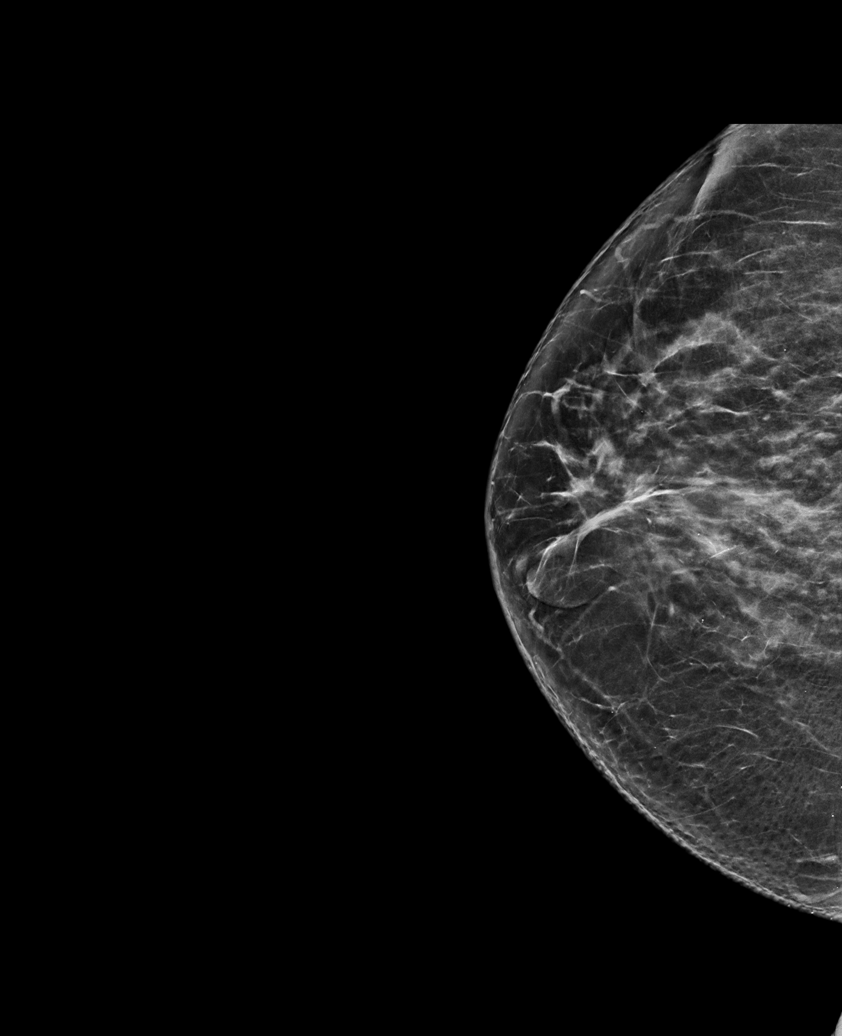

[L MLO synth-2D (2 of 2)]
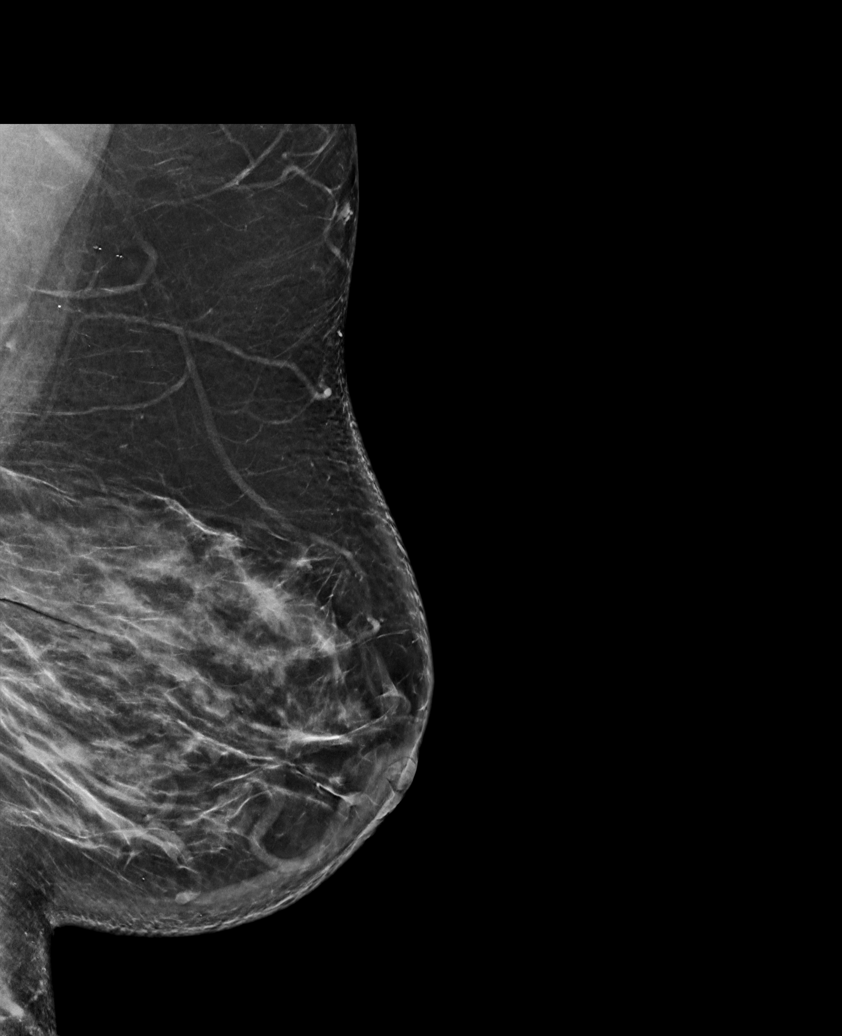

[R MLO synth-2D (2 of 2)]
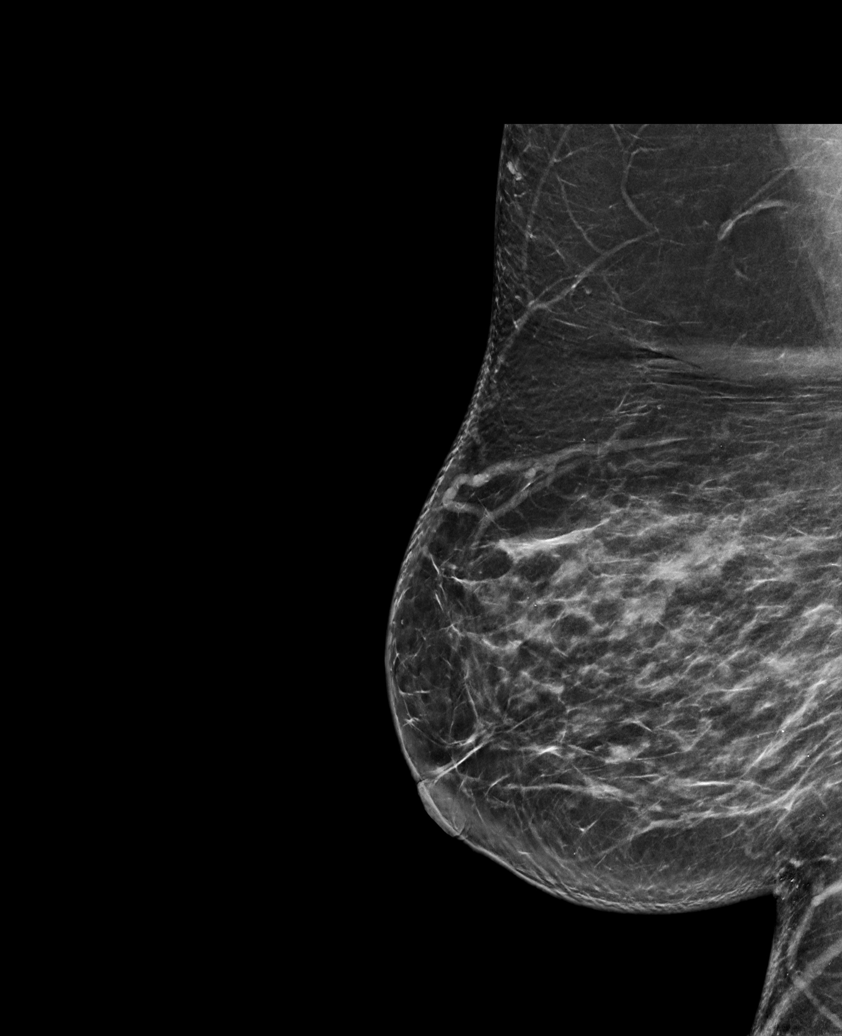

[L CC synth-2D]
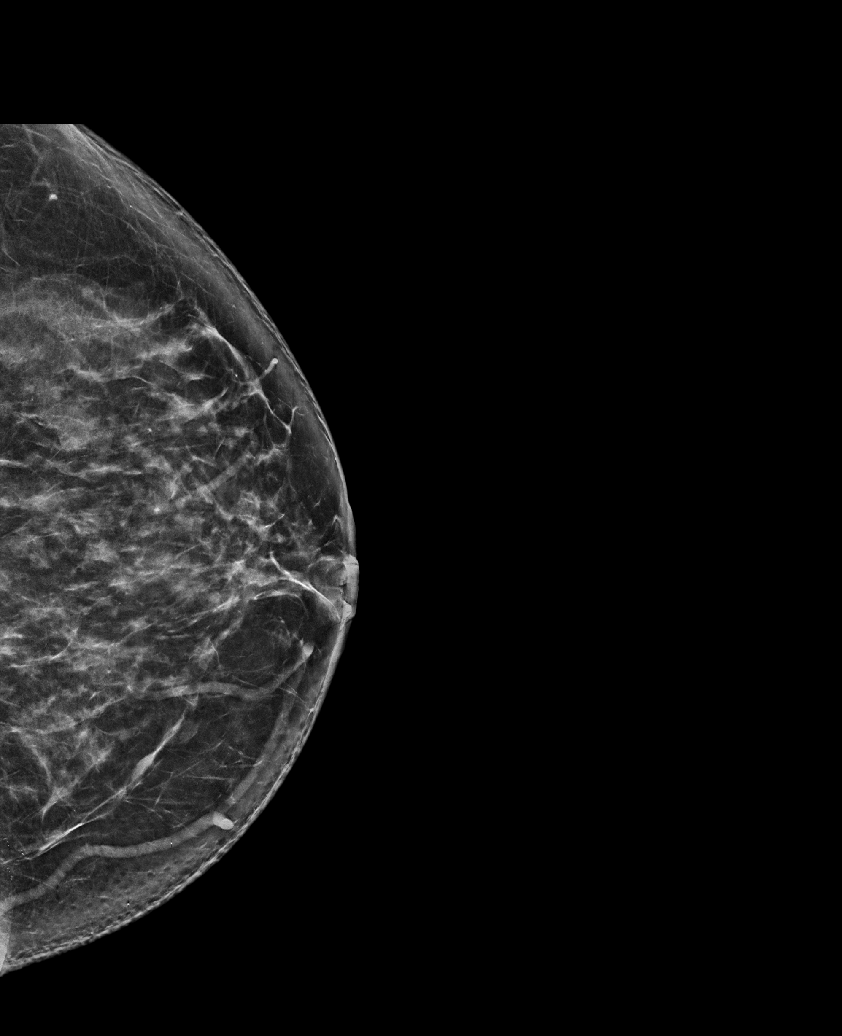

[6 of 36 positions shown; findings below may reference images not displayed]

ACR Breast Density Category c: The breast tissue is heterogeneously
dense, which may obscure small masses.
FINDINGS: There are no findings suspicious for malignancy. The images were
evaluated with computer-aided detection.
IMPRESSION: No mammographic evidence of malignancy. A result letter of this
screening mammogram will be mailed directly to the patient.

RECOMMENDATION:
Screening mammogram in one year. (Code:T4-5-GWO)

BI-RADS CATEGORY  1: Negative.

## 2022-06-26 ENCOUNTER — Ambulatory Visit: Payer: No Typology Code available for payment source | Admitting: Family

## 2022-11-17 ENCOUNTER — Other Ambulatory Visit: Payer: Self-pay

## 2022-11-17 DIAGNOSIS — Z20822 Contact with and (suspected) exposure to covid-19: Secondary | ICD-10-CM | POA: Insufficient documentation

## 2022-11-17 DIAGNOSIS — I10 Essential (primary) hypertension: Secondary | ICD-10-CM | POA: Insufficient documentation

## 2022-11-17 DIAGNOSIS — R202 Paresthesia of skin: Secondary | ICD-10-CM | POA: Insufficient documentation

## 2022-11-17 DIAGNOSIS — R109 Unspecified abdominal pain: Secondary | ICD-10-CM | POA: Insufficient documentation

## 2022-11-17 DIAGNOSIS — B349 Viral infection, unspecified: Secondary | ICD-10-CM | POA: Insufficient documentation

## 2022-11-17 DIAGNOSIS — M25511 Pain in right shoulder: Secondary | ICD-10-CM | POA: Insufficient documentation

## 2022-11-17 DIAGNOSIS — M62838 Other muscle spasm: Secondary | ICD-10-CM | POA: Insufficient documentation

## 2022-11-17 DIAGNOSIS — E119 Type 2 diabetes mellitus without complications: Secondary | ICD-10-CM | POA: Insufficient documentation

## 2022-11-17 LAB — CBC
HCT: 39.8 % (ref 36.0–46.0)
Hemoglobin: 13.4 g/dL (ref 12.0–15.0)
MCH: 29.5 pg (ref 26.0–34.0)
MCHC: 33.7 g/dL (ref 30.0–36.0)
MCV: 87.5 fL (ref 80.0–100.0)
Platelets: 239 10*3/uL (ref 150–400)
RBC: 4.55 MIL/uL (ref 3.87–5.11)
RDW: 13.8 % (ref 11.5–15.5)
WBC: 8 10*3/uL (ref 4.0–10.5)
nRBC: 0 % (ref 0.0–0.2)

## 2022-11-17 LAB — URINALYSIS, ROUTINE W REFLEX MICROSCOPIC
Bacteria, UA: NONE SEEN
Bilirubin Urine: NEGATIVE
Glucose, UA: >=500 mg/dL — AB
Hgb urine dipstick: NEGATIVE
Ketones, ur: NEGATIVE mg/dL
Leukocytes,Ua: NEGATIVE
Nitrite: NEGATIVE
Protein, ur: NEGATIVE mg/dL
Specific Gravity, Urine: 1.022 (ref 1.005–1.030)
pH: 5 (ref 5.0–8.0)

## 2022-11-17 LAB — COMPREHENSIVE METABOLIC PANEL
ALT: 28 U/L (ref 0–44)
AST: 22 U/L (ref 15–41)
Albumin: 3.6 g/dL (ref 3.5–5.0)
Alkaline Phosphatase: 128 U/L — ABNORMAL HIGH (ref 38–126)
Anion gap: 10 (ref 5–15)
BUN: 15 mg/dL (ref 6–20)
CO2: 26 mmol/L (ref 22–32)
Calcium: 9.2 mg/dL (ref 8.9–10.3)
Chloride: 99 mmol/L (ref 98–111)
Creatinine, Ser: 0.47 mg/dL (ref 0.44–1.00)
GFR, Estimated: >60 mL/min (ref >60)
Glucose, Bld: 274 mg/dL — ABNORMAL HIGH (ref 70–99)
Potassium: 3.4 mmol/L — ABNORMAL LOW (ref 3.5–5.1)
Sodium: 135 mmol/L (ref 135–145)
Total Bilirubin: 0.5 mg/dL (ref 0.3–1.2)
Total Protein: 7.5 g/dL (ref 6.5–8.1)

## 2022-11-17 LAB — POC URINE PREG, ED: Preg Test, Ur: NEGATIVE

## 2022-11-17 LAB — LIPASE, BLOOD: Lipase: 36 U/L (ref 11–51)

## 2022-11-17 NOTE — ED Triage Notes (Addendum)
Pt to ED via POV c/o lower abd pain x1week. Pt reports pain in lower abd, near incision sites. Pt had hernia and bladder surgery. Pt endorses headache and nausea. Denies CP, SOB, fevers. Pt also endorses lower back pain. Pt says she was having some drainage and bleeding from incision sites. No bleeding now. Pt having pain in right arm, denies any injury but says she uses that arm a lot for work

## 2022-11-18 ENCOUNTER — Emergency Department
Admission: EM | Admit: 2022-11-18 | Discharge: 2022-11-18 | Disposition: A | Discharge status: Home / Self Care | Payer: No Typology Code available for payment source | Attending: Emergency Medicine | Admitting: Emergency Medicine

## 2022-11-18 DIAGNOSIS — M62838 Other muscle spasm: Secondary | ICD-10-CM

## 2022-11-18 DIAGNOSIS — B349 Viral infection, unspecified: Secondary | ICD-10-CM

## 2022-11-18 HISTORY — DX: Type 2 diabetes mellitus without complications: E11.9

## 2022-11-18 HISTORY — DX: Essential (primary) hypertension: I10

## 2022-11-18 LAB — SARS CORONAVIRUS 2 BY RT PCR: SARS Coronavirus 2 by RT PCR: NEGATIVE

## 2022-11-18 MED ORDER — ONDANSETRON 4 MG PO TBDP: 4.0000 mg | ORAL_TABLET | Freq: Three times a day (TID) | ORAL | 0 refills | Status: AC | PRN

## 2022-11-18 MED ORDER — ONDANSETRON 4 MG PO TBDP
4.0000 mg | ORAL_TABLET | Freq: Once | ORAL | Status: AC
  Administered 2022-11-18: 4 mg via ORAL
  Filled 2022-11-18: qty 1

## 2022-11-18 MED ORDER — ACETAMINOPHEN 500 MG PO TABS
1000.0000 mg | ORAL_TABLET | Freq: Once | ORAL | Status: AC
  Administered 2022-11-18: 1000 mg via ORAL
  Filled 2022-11-18: qty 2

## 2022-11-18 MED ORDER — LIDOCAINE 5 % EX PTCH: 1.0000 | MEDICATED_PATCH | Freq: Two times a day (BID) | CUTANEOUS | 0 refills | Status: AC

## 2022-11-18 MED ORDER — KETOROLAC TROMETHAMINE 30 MG/ML IJ SOLN
30.0000 mg | Freq: Once | INTRAMUSCULAR | Status: AC
  Administered 2022-11-18: 30 mg via INTRAMUSCULAR
  Filled 2022-11-18: qty 1

## 2022-11-18 NOTE — Discharge Instructions (Addendum)
Please take Tylenol and ibuprofen/Advil for your pain.  It is safe to take them together, or to alternate them every few hours.  Take up to 1000mg of Tylenol at a time, up to 4 times per day.  Do not take more than 4000 mg of Tylenol in 24 hours.  For ibuprofen, take 400-600 mg, 3 - 4 times per day.  Please use lidocaine patches at your site of pain.  Apply 1 patch at a time, leave on for 12 hours, then remove for 12 hours.  12 hours on, 12 hours off.  Do not apply more than 1 patch at a time.  

## 2022-11-18 NOTE — ED Provider Notes (Signed)
[facility] Provider Note    Event Date/Time   First MD Initiated Contact with Patient 11/18/22 0010     (approximate)   History   Abdominal Pain and Arm Pain   HPI  Janet Franco is a 47 y.o. female who presents to the ED for evaluation of Abdominal Pain and Arm Pain   I review a surgical DC summary from 1 year ago.  Presented with evidence of acute cholecystitis who underwent robotic cholecystectomy as well as initial hernia repair.  Otherwise history of HTN, DM  Patient presents for evaluation of a resolved area of draining on her abdominal wall, headache nausea and change of taste, and some soreness to her right shoulder.  Last week she reports she had a small area on her anterior abdomen near a previous surgical incision from the cholecystectomy that was draining bloody material, she cleaned it with peroxide and put an ointment on it and it has since resolved.  No draining since last week.  Further reports a few days of of a mild headache, nauseous without emesis or abdominal pain, change of taste and overall malaise.  Also reports subacute right shoulder discomfort, sometimes with paresthesias in the right arm.  No weakness to the right hand or changes to fine motor control such as using her phone or other weakness.  Spanish interpreter utilized for history and physical  Physical Exam   Triage Vital Signs: ED Triage Vitals  Encounter Vitals Group     BP 11/17/22 2212 (!) 189/102     Systolic BP Percentile --      Diastolic BP Percentile --      Pulse Rate 11/17/22 2212 85     Resp 11/17/22 2212 18     Temp 11/17/22 2212 98.3 F (36.8 C)     Temp src --      SpO2 11/17/22 2212 100 %     Weight --      Height --      Head Circumference --      Peak Flow --      Pain Score 11/17/22 2210 5     Pain Loc --      Pain Education --      Exclude from Growth Chart --     Most recent vital signs: Vitals:   11/17/22 2212 11/18/22 0149  BP:  (!) 189/102 (!) 151/89  Pulse: 85 78  Resp: 18 18  Temp: 98.3 F (36.8 C) (!) 97.5 F (36.4 C)  SpO2: 100% 97%    General: Awake, no distress.  Well-appearing, pleasant and conversational CV:  Good peripheral perfusion.  Resp:  Normal effort.  Abd:  No distention.  Soft and benign without tenderness throughout.  Appears to be a healing tiny superficial abscess around her area of concern of previous draining.  I am unable to express any purulence.  No induration, fluctuance or signs of cellulitis or residual abscess. MSK:  No deformity noted.  Mild tenderness over the right trapezius musculature that reproduces her symptoms, no overlying skin changes or signs of trauma. Neuro:  No focal deficits appreciated. Other:     ED Results / Procedures / Treatments   Labs (all labs ordered are listed, but only abnormal results are displayed) Labs Reviewed  COMPREHENSIVE METABOLIC PANEL - Abnormal; Notable for the following components:      Result Value   Potassium 3.4 (*)    Glucose, Bld 274 (*)    Alkaline Phosphatase 128 (*)  All other components within normal limits  URINALYSIS, ROUTINE W REFLEX MICROSCOPIC - Abnormal; Notable for the following components:   Color, Urine YELLOW (*)    APPearance HAZY (*)    Glucose, UA >=500 (*)    All other components within normal limits  SARS CORONAVIRUS 2 BY RT PCR  LIPASE, BLOOD  CBC  POC URINE PREG, ED    EKG   RADIOLOGY   Official radiology report(s): No results found.  PROCEDURES and INTERVENTIONS:  Procedures  Medications  ketorolac (TORADOL) 30 MG/ML injection 30 mg (30 mg Intramuscular Given 11/18/22 0153)  acetaminophen (TYLENOL) tablet 1,000 mg (1,000 mg Oral Given 11/18/22 0151)  ondansetron (ZOFRAN-ODT) disintegrating tablet 4 mg (4 mg Oral Given 11/18/22 0152)     IMPRESSION / MDM / ASSESSMENT AND PLAN / ED COURSE  I reviewed the triage vital signs and the nursing notes.  Differential diagnosis includes, but is  not limited to, viral URI such as COVID, abscess or cellulitis, ventral hernia or incarcerated hernia, radiculopathy and muscular spasm  {Patient presents with symptoms of an acute illness or injury that is potentially life-threatening.  Patient presents with various complaints.  I suspect she had a small superficial abscess in her abdominal wall that she treated appropriately and it looks well to me without indications for diagnostics or antibiotic systemically.  No abdominal tenderness to necessitate cross-sectional imaging to assess for any hernias or incarcerated hernias.  Regarding her headache, nausea, malaise and taste changes it is certainly concerning for viral syndrome such as COVID and we will test her for this.  Regarding her right arm and shoulder I suspect a muscular spasm of the right trapezius  Will provide Tylenol, Toradol and Zofran as we check a COVID swab.  Urine, CBC, metabolic panel, lipase are all norma aside from mild hyperglycemia without acidosis l.  Suspect should be suitable for outpatient management.  Clinical Course as of 11/18/22 0342  Wed Nov 18, 2022  0340 Reassessed with interpreter. Feeling better. Discussed workup, possible etiology of symptoms.  Answered questions.  Discussed referral to PCP. [DS]    Clinical Course User Index [DS] Delton Prairie, MD     FINAL CLINICAL IMPRESSION(S) / ED DIAGNOSES   Final diagnoses:  Acute viral syndrome  Trapezius muscle spasm     Rx / DC Orders   ED Discharge Orders          Ordered    ondansetron (ZOFRAN-ODT) 4 MG disintegrating tablet  Every 8 hours PRN        11/18/22 0339    Ambulatory Referral to Primary Care (Establish Care)       Comments: Spanish speaking patient. Hx DM, obesity, HTN   11/18/22 0339    lidocaine (LIDODERM) 5 %  Every 12 hours        11/18/22 0340             Note:  This document was prepared using Dragon voice recognition software and may include unintentional dictation  errors.   Delton Prairie, MD 11/18/22 [phone]

## 2024-02-09 ENCOUNTER — Encounter: Payer: Self-pay | Admitting: Surgery
